# Patient Record
Sex: Male | Born: 1945 | Race: Black or African American | Hispanic: No | Marital: Married | Smoking: Former smoker
Health system: Southern US, Community
[De-identification: ages and names within clinical notes are randomized; demographics above are authoritative.]

## PROBLEM LIST (undated history)

## (undated) DIAGNOSIS — E039 Hypothyroidism, unspecified: Secondary | ICD-10-CM

## (undated) DIAGNOSIS — E119 Type 2 diabetes mellitus without complications: Secondary | ICD-10-CM

## (undated) DIAGNOSIS — I10 Essential (primary) hypertension: Secondary | ICD-10-CM

## (undated) DIAGNOSIS — E079 Disorder of thyroid, unspecified: Secondary | ICD-10-CM

## (undated) DIAGNOSIS — E785 Hyperlipidemia, unspecified: Secondary | ICD-10-CM

## (undated) DIAGNOSIS — M1990 Unspecified osteoarthritis, unspecified site: Secondary | ICD-10-CM

## (undated) DIAGNOSIS — K219 Gastro-esophageal reflux disease without esophagitis: Secondary | ICD-10-CM

## (undated) HISTORY — DX: Type 2 diabetes mellitus without complications: E11.9

## (undated) HISTORY — PX: THYROIDECTOMY: SHX17

## (undated) HISTORY — DX: Hyperlipidemia, unspecified: E78.5

## (undated) HISTORY — DX: Unspecified osteoarthritis, unspecified site: M19.90

## (undated) HISTORY — DX: Essential (primary) hypertension: I10

## (undated) HISTORY — DX: Disorder of thyroid, unspecified: E07.9

---

## 2010-08-02 LAB — CBC WITH AUTO DIFFERENTIAL
Absolute Baso #: 0.02 E9/L (ref 0.00–0.20)
Absolute Eos #: 0.11 E9/L (ref 0.05–0.50)
Absolute Lymph #: 2.03 E9/L (ref 1.50–4.00)
Absolute Mono #: 0.34 E9/L (ref 0.10–0.95)
Absolute Neut #: 3.56 E9/L (ref 1.80–7.30)
Basophils: 0 % (ref 0–2)
Eosinophils %: 2 % (ref 0–6)
HCT: 43.7 % (ref 37.0–54.0)
HGB: 14.7 g/dL (ref 12.5–16.5)
Lymphocytes: 33 % (ref 20–42)
MCH: 32.2 pg (ref 26.0–35.0)
MCHC: 33.7 % (ref 32.0–34.5)
MCV: 95.5 fL (ref 80.0–99.9)
MPV: 9.9 fL (ref 7.0–12.0)
Monocytes: 6 % (ref 2–12)
Platelets: 198 E9/L (ref 130–450)
RBC: 4.58 E12/L (ref 3.80–5.80)
RDW: 12.3 fL (ref 11.5–15.0)
Seg Neutrophils: 59 % (ref 43–80)
WBC: 6.1 E9/L (ref 4.5–11.5)

## 2010-08-02 LAB — COMPREHENSIVE METABOLIC PANEL
ALT: 27 U/L (ref 10–49)
AST: 24 U/L (ref 0–33)
Albumin: 4.2 g/dL (ref 3.2–4.8)
Alkaline Phosphatase: 81 U/L (ref 45–129)
BUN: 14 mg/dL (ref 9–23)
Bilirubin, Total: 0.3 mg/dL (ref 0.3–1.2)
CO2: 31 mmol/L (ref 20–31)
Calcium: 9.2 mg/dL (ref 8.6–10.5)
Chloride: 104 mmol/L (ref 99–109)
Creatinine: 0.8 mg/dL (ref 0.7–1.3)
Glucose: 121 mg/dL — ABNORMAL HIGH (ref 70–110)
Potassium: 4.8 mmol/L (ref 3.5–5.5)
Sodium: 141 mmol/L (ref 132–146)
Total Protein: 7.4 g/dL (ref 5.7–8.2)

## 2010-08-02 LAB — LIPID PANEL
Cholesterol: 227 mg/dL — ABNORMAL HIGH (ref 0–199)
HDL: 47 mg/dL (ref >40.0)
LDL Calculated: 160 mg/dL — ABNORMAL HIGH (ref 0–99)
Triglycerides: 102 mg/dL (ref 0–149)

## 2010-08-02 LAB — TSH: TSH: 0.692 u[IU]/mL (ref 0.350–5.000)

## 2010-08-02 LAB — HEMOGLOBIN A1C: Hemoglobin A1C: 6.4 % — ABNORMAL HIGH (ref 4.8–6.0)

## 2010-08-02 LAB — PSA SCREENING: Screening PSA: 1.76 ng/mL (ref 0.00–4.00)

## 2010-08-02 LAB — T3: T3, Total: 135 ng/dL (ref 60.0–181.0)

## 2010-08-02 LAB — GFR CALCULATED: Gfr Calculated: >60 mL/min/{1.73_m2} (ref >=60)

## 2010-08-02 LAB — T4: T4, Total: 11.2 ug/dL — ABNORMAL HIGH (ref 4.5–10.9)

## 2010-09-06 LAB — COMPREHENSIVE METABOLIC PANEL
ALT: 42 U/L (ref 10–49)
AST: 31 U/L (ref 0–33)
Albumin: 4 g/dL (ref 3.2–4.8)
Alkaline Phosphatase: 76 U/L (ref 45–129)
BUN: 10 mg/dL (ref 9–23)
Bilirubin, Total: 0.3 mg/dL (ref 0.3–1.2)
CO2: 30 mmol/L (ref 20–31)
Calcium: 9.1 mg/dL (ref 8.6–10.5)
Chloride: 106 mmol/L (ref 99–109)
Creatinine: 0.8 mg/dL (ref 0.7–1.3)
Glucose: 104 mg/dL (ref 70–110)
Potassium: 4.6 mmol/L (ref 3.5–5.5)
Sodium: 140 mmol/L (ref 132–146)
Total Protein: 7 g/dL (ref 5.7–8.2)

## 2010-09-06 LAB — LIPID PANEL
Cholesterol: 152 mg/dL (ref 0–199)
HDL: 42 mg/dL (ref >40.0)
LDL Calculated: 93 mg/dL (ref 0–99)
Triglycerides: 85 mg/dL (ref 0–149)

## 2010-09-06 LAB — T4: T4, Total: 10.2 ug/dL (ref 4.5–10.9)

## 2010-09-06 LAB — T3: T3, Total: 156.8 ng/dL (ref 60.0–181.0)

## 2010-09-06 LAB — TSH: TSH: 1.032 u[IU]/mL (ref 0.350–5.000)

## 2010-09-06 LAB — GFR CALCULATED: Gfr Calculated: >60 mL/min/{1.73_m2} (ref >=60)

## 2011-07-31 LAB — CBC WITH AUTO DIFFERENTIAL
Absolute Eos #: 0.13 E9/L (ref 0.05–0.50)
Absolute Lymph #: 2.13 E9/L (ref 1.50–4.00)
Absolute Mono #: 0.36 E9/L (ref 0.10–0.95)
Absolute Neut #: 3.93 E9/L (ref 1.80–7.30)
Basophils Absolute: 0.01 E9/L (ref 0.00–0.20)
Basophils: 0 % (ref 0–2)
Eosinophils %: 2 % (ref 0–6)
Hematocrit: 41.9 % (ref 37.0–54.0)
Hemoglobin: 14 g/dL (ref 12.5–16.5)
Lymphocytes: 33 % (ref 20–42)
MCH: 31.8 pg (ref 26.0–35.0)
MCHC: 33.4 % (ref 32.0–34.5)
MCV: 95.1 fL (ref 80.0–99.9)
MPV: 10.4 fL (ref 7.0–12.0)
Monocytes: 6 % (ref 2–12)
Platelets: 193 E9/L (ref 130–450)
RBC: 4.41 E12/L (ref 3.80–5.80)
RDW: 12.4 fL (ref 11.5–15.0)
Seg Neutrophils: 60 % (ref 43–80)
WBC: 6.6 E9/L (ref 4.5–11.5)

## 2011-07-31 LAB — COMPREHENSIVE METABOLIC PANEL
ALT: 30 U/L (ref 10–49)
AST: 25 U/L (ref 0–33)
Albumin: 4 g/dL (ref 3.2–4.8)
Alkaline Phosphatase: 82 U/L (ref 45–129)
BUN: 12 mg/dL (ref 9–23)
CO2: 29 mmol/L (ref 20–31)
Calcium: 9.4 mg/dL (ref 8.6–10.5)
Chloride: 106 mmol/L (ref 99–109)
Creatinine: 0.8 mg/dL (ref 0.7–1.3)
Glucose: 109 mg/dL (ref 70–110)
Potassium: 4.6 mmol/L (ref 3.5–5.5)
Sodium: 141 mmol/L (ref 132–146)
Total Bilirubin: 0.3 mg/dL (ref 0.3–1.2)
Total Protein: 7.1 g/dL (ref 5.7–8.2)

## 2011-07-31 LAB — TSH: TSH: 1.399 u[IU]/mL (ref 0.350–5.000)

## 2011-07-31 LAB — LIPID PANEL
Cholesterol: 147 mg/dL (ref 0–199)
HDL: 43 mg/dL (ref >40.0)
LDL Calculated: 81 mg/dL (ref 0–99)
Triglycerides: 114 mg/dL (ref 0–149)

## 2011-07-31 LAB — PSA SCREENING: Screening PSA: 2.86 ng/mL (ref 0.00–4.00)

## 2011-07-31 LAB — T3: T3, Total: 139 ng/dL (ref 60.0–181.0)

## 2011-07-31 LAB — HEMOGLOBIN A1C: Hemoglobin A1C: 6.7 % — ABNORMAL HIGH (ref 4.8–6.0)

## 2011-07-31 LAB — GFR CALCULATED: Gfr Calculated: >60 mL/min/{1.73_m2} (ref >=60)

## 2011-07-31 LAB — T4: T4, Total: 11.9 ug/dL — ABNORMAL HIGH (ref 4.5–10.9)

## 2011-09-16 LAB — PROSTATE CANCER GENE 3

## 2012-04-06 LAB — TESTOSTERONE: Testosterone: 190 ng/dL

## 2012-04-06 LAB — PSA, DIAGNOSTIC: Diagnostic Psa: 1.99 ng/mL (ref 0.00–4.00)

## 2012-06-16 MED ORDER — CRESTOR 10 MG PO TABS
10 MG | ORAL_TABLET | ORAL | Status: DC
Start: 2012-06-16 — End: 2013-03-03

## 2013-03-03 MED ORDER — ROSUVASTATIN CALCIUM 10 MG PO TABS
10 MG | ORAL_TABLET | ORAL | Status: DC
Start: 2013-03-03 — End: 2014-08-05

## 2013-03-03 NOTE — Progress Notes (Signed)
Subjective:      Patient ID: Donald Jackson is a 67 y.o. male.  Doing well, no new complaints  HPI  Cholesterol improved  Diabetes improving  HTN improved  Fatigue resolved  Review of Systems   Constitutional: Negative for fever, chills, appetite change, fatigue and unexpected weight change.   HENT: Negative for hearing loss, sore throat, rhinorrhea, sneezing, sinus pressure and tinnitus.    Eyes: Negative for visual disturbance.   Respiratory: Negative for cough, chest tightness and shortness of breath.    Cardiovascular: Negative for chest pain, palpitations and leg swelling.   Gastrointestinal: Negative for nausea, vomiting, abdominal pain and diarrhea.   Endocrine: Negative for cold intolerance and heat intolerance.   Genitourinary: Negative for dysuria, urgency and hematuria.   Musculoskeletal: Negative for myalgias and arthralgias.   Skin: Negative for rash.   Neurological: Negative for dizziness, weakness and light-headedness.   Hematological: Does not bruise/bleed easily.   Psychiatric/Behavioral: Negative for confusion and dysphoric mood.     Past Medical History   Diagnosis Date   ??? Hypertension    ??? Hyperlipidemia        History   Substance Use Topics   ??? Smoking status: Former Smoker     Quit date: 03/03/1988   ??? Smokeless tobacco: Never Used   ??? Alcohol Use: No       Family History   Problem Relation Age of Onset   ??? Diabetes Mother    ??? Cancer Father      Prostate Cancer       Current Outpatient Prescriptions   Medication Sig Dispense Refill   ??? NIFEdipine (ADALAT CC) 30 MG CR tablet Take 30 mg by mouth daily.       ??? aspirin 81 MG tablet Take 81 mg by mouth daily.       ??? CRESTOR 10 MG tablet TAKE 1 TABLET DAILY  90 tablet  2     No current facility-administered medications for this visit.       No Known Allergies    I have reviewed Donald Jackson's allergies, medications, problem list, medical, social and family history and have updated as needed in the electronic medical record.        Objective:   Physical  Exam   Vitals reviewed.  Constitutional: He is oriented to person, place, and time. He appears well-developed and well-nourished.   HENT:   Head: Normocephalic and atraumatic.   Right Ear: External ear normal.   Left Ear: External ear normal.   Nose: Nose normal.   Mouth/Throat: Oropharynx is clear and moist.   Eyes: Conjunctivae and EOM are normal. Pupils are equal, round, and reactive to light.   Neck: Normal range of motion. Neck supple.   Cardiovascular: Normal rate, regular rhythm, normal heart sounds and intact distal pulses.    No murmur heard.  Pulmonary/Chest: Effort normal and breath sounds normal.   Abdominal: Soft. Bowel sounds are normal. There is no tenderness.   Musculoskeletal: Normal range of motion.   Neurological: He is alert and oriented to person, place, and time.   Skin: Skin is warm and dry. No rash noted.   Psychiatric: He has a normal mood and affect. His behavior is normal. Thought content normal.       Assessment / Plan:        Donald Jackson was seen today for check-up.    Diagnoses and associated orders for this visit:    Hypercholesterolemia  - Comprehensive metabolic panel; Future  -  rosuvastatin (CRESTOR) 10 MG tablet; TAKE 1 TABLET DAILY    HTN (hypertension)    Prostate cancer screening  - Psa screening; Future    DM (diabetes mellitus) (HCC)  - Hemoglobin A1c; Future    Fatigue  - CBC Auto Differential; Future  - TSH without Reflex; Future    Depression screening  - Negative Screen for Clinical Depression, Follow-up not Required [phone]    Screening  Comments: Fall Risk Assessment Completed    Other Orders  - NIFEdipine (ADALAT CC) 30 MG CR tablet; Take 30 mg by mouth daily.  - aspirin 81 MG tablet; Take 81 mg by mouth daily.        Reviewed health maintenance summary and advised Donald Jackson of deficiency's. He will consider and notify office when ready to proceed. Discussed need for colonoscopy and age appropriate immunizations  Continue all specialist  Emergency dept any problems

## 2013-03-04 LAB — COMPREHENSIVE METABOLIC PANEL
ALT: 21 U/L (ref 0–40)
AST: 24 U/L (ref 0–39)
Albumin: 4.1 g/dL (ref 3.5–5.2)
Alkaline Phosphatase: 80 U/L (ref 40–129)
BUN: 9 mg/dL (ref 8–23)
CO2: 27 mmol/L (ref 22–29)
Calcium: 9.6 mg/dL (ref 8.6–10.2)
Chloride: 101 mmol/L (ref 98–107)
Creatinine: 0.9 mg/dL (ref 0.7–1.2)
GFR African American: >60
GFR Non-African American: >60 mL/min/{1.73_m2} (ref >=60)
Glucose: 108 mg/dL (ref 74–109)
Potassium: 3.8 mmol/L (ref 3.5–5.0)
Sodium: 139 mmol/L (ref 132–146)
Total Bilirubin: 0.2 mg/dL (ref 0.0–1.2)
Total Protein: 7.5 g/dL (ref 6.4–8.3)

## 2013-03-04 LAB — CBC WITH AUTO DIFFERENTIAL
Basophils %: 0 % (ref 0–2)
Basophils Absolute: 0.04 E9/L (ref 0.00–0.20)
Eosinophils %: 1 % (ref 0–6)
Eosinophils Absolute: 0.12 E9/L (ref 0.05–0.50)
Hematocrit: 42.1 % (ref 37.0–54.0)
Hemoglobin: 14.1 g/dL (ref 12.5–16.5)
Lymphocytes %: 29 % (ref 20–42)
Lymphocytes Absolute: 2.81 E9/L (ref 1.50–4.00)
MCH: 31.8 pg (ref 26.0–35.0)
MCHC: 33.5 % (ref 32.0–34.5)
MCV: 94.8 fL (ref 80.0–99.9)
MPV: 10.1 fL (ref 7.0–12.0)
Monocytes %: 4 % (ref 2–12)
Monocytes Absolute: 0.4 E9/L (ref 0.10–0.95)
Neutrophils %: 65 % (ref 43–80)
Neutrophils Absolute: 6.22 E9/L (ref 1.80–7.30)
Platelets: 202 E9/L (ref 130–450)
RBC: 4.44 E12/L (ref 3.80–5.80)
RDW: 13 fL (ref 11.5–15.0)
WBC: 9.6 E9/L (ref 4.5–11.5)

## 2013-03-04 LAB — PSA SCREENING: PSA: 2.17 ng/mL (ref 0.00–4.00)

## 2013-03-04 LAB — HEMOGLOBIN A1C: Hemoglobin A1C: 6.7 % — ABNORMAL HIGH (ref 4.8–5.9)

## 2013-03-04 LAB — TSH: TSH: 1.38 u[IU]/mL (ref 0.270–4.200)

## 2013-03-17 NOTE — Progress Notes (Signed)
Subjective:      Patient ID: Donald Jackson is a 67 y.o. male.  Doing well, no new complaints  HPI  HTN improved  Cholesterol improved  Obesity uncontrolled  Diabetes diet controlled  Review of Systems   Constitutional: Negative for fever, chills, appetite change, fatigue and unexpected weight change.   HENT: Negative for hearing loss, sore throat, rhinorrhea, sneezing, sinus pressure and tinnitus.    Eyes: Negative for visual disturbance.   Respiratory: Negative for cough, chest tightness and shortness of breath.    Cardiovascular: Negative for chest pain, palpitations and leg swelling.   Gastrointestinal: Negative for nausea, vomiting, abdominal pain and diarrhea.   Endocrine: Negative for cold intolerance and heat intolerance.   Genitourinary: Negative for dysuria, urgency and hematuria.   Musculoskeletal: Negative for myalgias and arthralgias.   Skin: Negative for rash.   Neurological: Negative for dizziness, weakness and light-headedness.   Hematological: Does not bruise/bleed easily.   Psychiatric/Behavioral: Negative for confusion and dysphoric mood.     Past Medical History   Diagnosis Date   ??? Hypertension    ??? Hyperlipidemia        History   Substance Use Topics   ??? Smoking status: Former Smoker     Quit date: 03/03/1988   ??? Smokeless tobacco: Never Used   ??? Alcohol Use: No       Family History   Problem Relation Age of Onset   ??? Diabetes Mother    ??? Cancer Father      Prostate Cancer       Current Outpatient Prescriptions   Medication Sig Dispense Refill   ??? NIFEdipine (ADALAT CC) 30 MG CR tablet Take 30 mg by mouth daily.       ??? aspirin 81 MG tablet Take 81 mg by mouth daily.       ??? rosuvastatin (CRESTOR) 10 MG tablet TAKE 1 TABLET DAILY  90 tablet  3     No current facility-administered medications for this visit.       No Known Allergies    I have reviewed Donald Jackson's allergies, medications, problem list, medical, social and family history and have updated as needed in the electronic medical  record.        Objective:   Physical Exam   Constitutional: He is oriented to person, place, and time. He appears well-developed and well-nourished.   HENT:   Head: Normocephalic and atraumatic.   Right Ear: External ear normal.   Left Ear: External ear normal.   Nose: Nose normal.   Mouth/Throat: Oropharynx is clear and moist.   Eyes: Conjunctivae and EOM are normal. Pupils are equal, round, and reactive to light.   Neck: Normal range of motion.   Cardiovascular: Normal rate, regular rhythm and normal heart sounds.    No murmur heard.  Pulmonary/Chest: Effort normal and breath sounds normal.   Abdominal: Soft. Bowel sounds are normal. There is no tenderness.   Musculoskeletal: Normal range of motion.   Neurological: He is alert and oriented to person, place, and time.   Skin: Skin is warm and dry. No rash noted.   Psychiatric: He has a normal mood and affect. His behavior is normal. Thought content normal.   Vitals reviewed.        Assessment / Plan:        Donald Jackson was seen today for 2 week follow-up and discuss labs.    Diagnoses and associated orders for this visit:    Hypercholesterolemia  HTN (hypertension)    Colon cancer screening  - External Referral To Gastroenterology    DM (diabetes mellitus) (HCC)    Obesity      Reviewed health maintenance summary and advised Donald Jackson of deficiency's. He will consider and notify office when ready to proceed. Discussed need for colonoscopy and age appropriate immunizations.  Discussed importance of diet, exercise and weight loss  Continue all specialist  Emergency dept any problems

## 2013-07-29 MED ORDER — CLOTRIMAZOLE-BETAMETHASONE 1-0.05 % EX CREA
CUTANEOUS | Status: DC
Start: 2013-07-29 — End: 2015-02-14

## 2013-07-29 NOTE — Progress Notes (Signed)
Subjective:      Patient ID: Donald Jackson is a 67 y.o. male.  Left ear pain for the past couple of days improved today  Otalgia   Associated symptoms include a rash. Pertinent negatives include no abdominal pain, coughing, diarrhea, hearing loss, rhinorrhea, sore throat or vomiting.   Itchy rash on extremities for past week   HTN will continue to monitor  Obesity uncontrolled  Cholesterol needs reevaluated in 1 month  Diabetes currently controlled with diet  Review of Systems   Constitutional: Negative for fever, chills, appetite change, fatigue and unexpected weight change.   HENT: Positive for ear pain (resolved ). Negative for hearing loss, rhinorrhea, sinus pressure, sneezing, sore throat and tinnitus.    Eyes: Negative for visual disturbance.   Respiratory: Negative for cough, chest tightness and shortness of breath.    Cardiovascular: Negative for chest pain, palpitations and leg swelling.   Gastrointestinal: Negative for nausea, vomiting, abdominal pain and diarrhea.   Endocrine: Negative for cold intolerance and heat intolerance.   Genitourinary: Negative for dysuria, urgency and hematuria.   Musculoskeletal: Negative for myalgias and arthralgias.   Skin: Positive for rash.   Neurological: Negative for dizziness, weakness and light-headedness.   Hematological: Does not bruise/bleed easily.   Psychiatric/Behavioral: Negative for confusion and dysphoric mood.     Past Medical History   Diagnosis Date   ??? Hypertension    ??? Hyperlipidemia        History   Substance Use Topics   ??? Smoking status: Former Smoker     Quit date: 03/03/1988   ??? Smokeless tobacco: Never Used   ??? Alcohol Use: No       Family History   Problem Relation Age of Onset   ??? Diabetes Mother    ??? Cancer Father      Prostate Cancer       Current Outpatient Prescriptions   Medication Sig Dispense Refill   ??? NIFEdipine (ADALAT CC) 30 MG CR tablet Take 30 mg by mouth daily.       ??? aspirin 81 MG tablet Take 81 mg by mouth daily.       ???  rosuvastatin (CRESTOR) 10 MG tablet TAKE 1 TABLET DAILY  90 tablet  3     No current facility-administered medications for this visit.       No Known Allergies    I have reviewed Donald Jackson's allergies, medications, problem list, medical, social and family history and have updated as needed in the electronic medical record.    ROS: negative other what was mentioned above.         Objective:   Physical Exam   Constitutional: He is oriented to person, place, and time. He appears well-developed and well-nourished.   HENT:   Head: Normocephalic and atraumatic.   Right Ear: External ear normal.   Left Ear: External ear normal.   Nose: Nose normal.   Mouth/Throat: Oropharynx is clear and moist.   Eyes: Conjunctivae and EOM are normal. Pupils are equal, round, and reactive to light.   Neck: Normal range of motion. Neck supple.   Cardiovascular: Normal rate, regular rhythm, normal heart sounds and intact distal pulses.    No murmur heard.  Pulmonary/Chest: Effort normal and breath sounds normal.   Abdominal: Soft. Bowel sounds are normal. There is no tenderness.   Musculoskeletal: Normal range of motion.   Neurological: He is alert and oriented to person, place, and time.   Skin: Skin is warm and  dry. Rash (diffuuse areas of mild scaly erythematous rash on extremities) noted.   Psychiatric: He has a normal mood and affect. His behavior is normal. Thought content normal.   Vitals reviewed.      Assessment / Plan:        Donald Jackson was seen today for otalgia.    Diagnoses and associated orders for this visit:    Tinea  - clotrimazole-betamethasone (LOTRISONE) 1-0.05 % cream; Apply topically 2 times daily.    Hypercholesterolemia    HTN (hypertension)    DM (diabetes mellitus) (HCC)    Obesity      Continue all specialist  Discussed importance of diet, exercise and weight loss. Discussed dietary restriction to help improve blood sugars  Reviewed health maintenance summary and advised Donald Jackson of deficiency's. He will consider and notify  office when ready to proceed. Discussed importance of colonoscopy and immunization, he is aware of the risk and my concern of not having  Emergency dept any problems

## 2013-09-06 NOTE — Progress Notes (Signed)
Lab work drawn. Patient to follow up in 1 or 2 weeks to discuss lab results with Dr. Marva PandaKraker

## 2013-09-07 LAB — CBC WITH AUTO DIFFERENTIAL
Basophils %: 1 % (ref 0–2)
Basophils Absolute: 0.03 E9/L (ref 0.00–0.20)
Eosinophils %: 2 % (ref 0–6)
Eosinophils Absolute: 0.13 E9/L (ref 0.05–0.50)
Hematocrit: 41.3 % (ref 37.0–54.0)
Hemoglobin: 13.8 g/dL (ref 12.5–16.5)
Lymphocytes %: 34 % (ref 20–42)
Lymphocytes Absolute: 2.09 E9/L (ref 1.50–4.00)
MCH: 31.7 pg (ref 26.0–35.0)
MCHC: 33.4 % (ref 32.0–34.5)
MCV: 94.9 fL (ref 80.0–99.9)
MPV: 9.7 fL (ref 7.0–12.0)
Monocytes %: 5 % (ref 2–12)
Monocytes Absolute: 0.3 E9/L (ref 0.10–0.95)
Neutrophils %: 58 % (ref 43–80)
Neutrophils Absolute: 3.59 E9/L (ref 1.80–7.30)
Platelets: 205 E9/L (ref 130–450)
RBC: 4.36 E12/L (ref 3.80–5.80)
RDW: 12.8 fL (ref 11.5–15.0)
WBC: 6.1 E9/L (ref 4.5–11.5)

## 2013-09-07 LAB — COMPREHENSIVE METABOLIC PANEL
ALT: 24 U/L (ref 0–40)
AST: 22 U/L (ref 0–39)
Albumin: 3.8 g/dL (ref 3.5–5.2)
Alkaline Phosphatase: 87 U/L (ref 40–129)
BUN: 10 mg/dL (ref 8–23)
CO2: 26 mmol/L (ref 22–29)
Calcium: 9.2 mg/dL (ref 8.6–10.2)
Chloride: 106 mmol/L (ref 98–107)
Creatinine: 0.9 mg/dL (ref 0.7–1.2)
GFR African American: >60
GFR Non-African American: >60 mL/min/{1.73_m2} (ref >=60)
Glucose: 115 mg/dL — ABNORMAL HIGH (ref 74–109)
Potassium: 4.6 mmol/L (ref 3.5–5.0)
Sodium: 142 mmol/L (ref 132–146)
Total Bilirubin: 0.3 mg/dL (ref 0.0–1.2)
Total Protein: 7.1 g/dL (ref 6.4–8.3)

## 2013-09-07 LAB — LIPID PANEL
Cholesterol, Total: 157 mg/dL (ref 0–199)
HDL: 48 mg/dL (ref >40)
LDL Calculated: 91 mg/dL (ref 0–99)
Triglycerides: 89 mg/dL (ref 0–149)
VLDL Cholesterol Calculated: 18 mg/dL

## 2013-09-07 LAB — HEMOGLOBIN A1C: Hemoglobin A1C: 6.9 % — ABNORMAL HIGH (ref 4.8–5.9)

## 2013-09-07 LAB — TSH: TSH: 0.222 u[IU]/mL — ABNORMAL LOW (ref 0.270–4.200)

## 2013-09-07 LAB — PSA SCREENING: PSA: 2.92 ng/mL (ref 0.00–4.00)

## 2014-08-07 MED ORDER — CRESTOR 10 MG PO TABS
10 MG | ORAL_TABLET | ORAL | Status: DC
Start: 2014-08-07 — End: 2014-10-26

## 2014-09-15 LAB — CBC
Hematocrit: 44.1 % (ref 37.0–54.0)
Hemoglobin: 14.5 g/dL (ref 12.5–16.5)
MCH: 31.1 pg (ref 26.0–35.0)
MCHC: 32.9 % (ref 32.0–34.5)
MCV: 94.7 fL (ref 80.0–99.9)
MPV: 9 fL (ref 7.0–12.0)
Platelets: 215 E9/L (ref 130–450)
RBC: 4.65 E12/L (ref 3.80–5.80)
RDW: 12.6 fL (ref 11.5–15.0)
WBC: 6.8 E9/L (ref 4.5–11.5)

## 2014-09-15 LAB — COMPREHENSIVE METABOLIC PANEL
ALT: 30 U/L (ref 0–40)
AST: 26 U/L (ref 0–39)
Albumin: 3.8 g/dL (ref 3.5–5.2)
Alkaline Phosphatase: 85 U/L (ref 40–129)
Anion Gap: 10 mmol/L (ref 7–16)
BUN: 8 mg/dL (ref 8–23)
CO2: 30 mmol/L — ABNORMAL HIGH (ref 22–29)
Calcium: 9.4 mg/dL (ref 8.6–10.2)
Chloride: 102 mmol/L (ref 98–107)
Creatinine: 0.9 mg/dL (ref 0.7–1.2)
GFR African American: >60
GFR Non-African American: >60 mL/min/{1.73_m2} (ref >=60)
Glucose: 140 mg/dL — ABNORMAL HIGH (ref 74–109)
Potassium: 4.6 mmol/L (ref 3.5–5.0)
Sodium: 142 mmol/L (ref 132–146)
Total Bilirubin: 0.3 mg/dL (ref 0.0–1.2)
Total Protein: 7.5 g/dL (ref 6.4–8.3)

## 2014-09-15 LAB — TSH: TSH: 1.16 u[IU]/mL (ref 0.270–4.200)

## 2014-09-15 LAB — HIGH SENSITIVITY CRP: CRP High Sensitivity: 2.4 mg/L (ref 0.0–3.0)

## 2014-09-15 LAB — LIPID PANEL
Cholesterol, Total: 170 mg/dL (ref 0–199)
HDL: 56 mg/dL (ref >40)
LDL Calculated: 100 mg/dL — ABNORMAL HIGH (ref 0–99)
Triglycerides: 71 mg/dL (ref 0–149)
VLDL Cholesterol Calculated: 14 mg/dL

## 2014-09-15 LAB — T3: T3, Total: 141.8 ng/dL (ref 80.00–200.00)

## 2014-09-15 LAB — T4: T4, Total: 8.8 ug/dL (ref 4.5–11.7)

## 2014-10-26 ENCOUNTER — Ambulatory Visit: Admit: 2014-10-26 | Discharge: 2014-10-26 | Payer: MEDICARE | Attending: Family Medicine | Primary: Geriatric Medicine

## 2014-10-26 DIAGNOSIS — I10 Essential (primary) hypertension: Secondary | ICD-10-CM

## 2014-10-26 MED ORDER — LISINOPRIL 10 MG PO TABS
10 MG | ORAL_TABLET | Freq: Every day | ORAL | Status: AC
Start: 2014-10-26 — End: ?

## 2014-10-26 MED ORDER — METFORMIN HCL 500 MG PO TABS
500 MG | ORAL_TABLET | Freq: Two times a day (BID) | ORAL | Status: DC
Start: 2014-10-26 — End: 2015-02-14

## 2014-10-26 MED ORDER — ROSUVASTATIN CALCIUM 10 MG PO TABS
10 MG | ORAL_TABLET | ORAL | Status: AC
Start: 2014-10-26 — End: ?

## 2014-10-26 NOTE — Patient Instructions (Signed)
Follow up after thyroid ultrasound to discuss results with Dr. Marva PandaKraker

## 2014-10-26 NOTE — Progress Notes (Signed)
Subjective:      Patient ID: Donald Jackson is a 69 y.o. male.  Here for evaluation of medical problems  HPI  Abnormal thyroid found on carotid artery ultrasound, will dedicate ultrasound for thyroid nodule and possible ENT evaluation  HTN will continue to monitor  Obesity uncontrolled  Cholesterol needs reevaluated in 1 month  Diabetes currently controlled with diet  Review of Systems   Constitutional: Negative for fever, chills, appetite change, fatigue and unexpected weight change.   HENT: Negative for hearing loss, rhinorrhea, sinus pressure, sneezing, sore throat and tinnitus.    Eyes: Negative for visual disturbance.   Respiratory: Negative for cough, chest tightness and shortness of breath.    Cardiovascular: Negative for chest pain, palpitations and leg swelling.   Gastrointestinal: Negative for nausea, vomiting, abdominal pain and diarrhea.   Endocrine: Negative for cold intolerance and heat intolerance.   Genitourinary: Negative for dysuria, urgency and hematuria.   Musculoskeletal: Negative for myalgias and arthralgias.   Skin: Negative for rash.   Neurological: Negative for dizziness, weakness and light-headedness.   Hematological: Does not bruise/bleed easily.   Psychiatric/Behavioral: Negative for confusion and dysphoric mood.     Past Medical History   Diagnosis Date   ??? Hypertension    ??? Hyperlipidemia        History   Substance Use Topics   ??? Smoking status: Former Smoker     Quit date: 03/03/1988   ??? Smokeless tobacco: Never Used   ??? Alcohol Use: No       Family History   Problem Relation Age of Onset   ??? Diabetes Mother    ??? Cancer Father      Prostate Cancer       Current Outpatient Prescriptions   Medication Sig Dispense Refill   ??? CRESTOR 10 MG tablet TAKE 1 TABLET DAILY 90 tablet 0   ??? NIFEdipine (ADALAT CC) 30 MG CR tablet Take 30 mg by mouth daily.     ??? aspirin 81 MG tablet Take 81 mg by mouth daily.     ??? clotrimazole-betamethasone (LOTRISONE) 1-0.05 % cream Apply topically 2 times daily.  1 Tube 2     No current facility-administered medications for this visit.       No Known Allergies    I have reviewed Donald Jackson's allergies, medications, problem list, medical, social and family history and have updated as needed in the electronic medical record.    ROS: negative other what was mentioned above.         Objective:   Physical Exam   Constitutional: He is oriented to person, place, and time. He appears well-developed and well-nourished.   HENT:   Head: Normocephalic and atraumatic.   Right Ear: External ear normal.   Left Ear: External ear normal.   Nose: Nose normal.   Mouth/Throat: Oropharynx is clear and moist.   Eyes: Conjunctivae and EOM are normal. Pupils are equal, round, and reactive to light.   Neck: Normal range of motion. Neck supple.   Cardiovascular: Normal rate, regular rhythm and normal heart sounds.    No murmur heard.  Pulmonary/Chest: Effort normal and breath sounds normal.   Abdominal: Soft. Bowel sounds are normal. There is no tenderness.   Musculoskeletal: Normal range of motion.   Neurological: He is alert and oriented to person, place, and time.   Skin: Skin is warm and dry. No rash noted.   Psychiatric: He has a normal mood and affect. His behavior is normal. Thought  content normal.   Vitals reviewed.        Assessment / Plan:        Donald Jackson was seen today for hypertension, hyperlipidemia and results.    Diagnoses and associated orders for this visit:    Essential hypertension  - lisinopril (PRINIVIL;ZESTRIL) 10 MG tablet; Take 1 tablet by mouth daily    Hypercholesterolemia  - rosuvastatin (CRESTOR) 10 MG tablet; TAKE 1 TABLET DAILY    Type 2 diabetes mellitus without complication (HCC)  - metFORMIN (GLUCOPHAGE) 500 MG tablet; Take 1 tablet by mouth 2 times daily (with meals)    Thyroid nodule  - Cancel: US Thyroid  - US Head Neck Soft Tissue Thyroid  - [facility]- Bunevich, Jared, DO          Will obtain thyroid ultrasound and if any abnormalities will consult  ENT  Follow up in 2 weeks to reevaluate blood pressure, HGA1C and review thyroid ultrasound  Continue all specialist  Discussed importance of diet, exercise and weight loss. Discussed dietary restriction to help improve blood sugars  Reviewed health maintenance summary and advised Donald Jackson of deficiency's. He will consider and notify office when ready to proceed. Discussed importance of colonoscopy and immunization, he is aware of the risk and my concern of not having  Emergency dept any problems

## 2014-10-31 ENCOUNTER — Inpatient Hospital Stay: Admit: 2014-10-31 | Attending: Family Medicine | Primary: Geriatric Medicine

## 2014-11-30 ENCOUNTER — Ambulatory Visit: Admit: 2014-11-30 | Discharge: 2014-11-30 | Payer: MEDICARE | Attending: Family Medicine | Primary: Geriatric Medicine

## 2014-11-30 DIAGNOSIS — I10 Essential (primary) hypertension: Secondary | ICD-10-CM

## 2014-11-30 NOTE — Progress Notes (Signed)
Subjective:      Patient ID: Donald Jackson is a 69 y.o. male.  Here for follow up  HPI  Abnormal thyroid found on carotid artery ultrasound, thyroid ultrasound shows multiple nodules and enlarged thyroid, will consult ENT  HTN will continue to monitor   Obesity uncontrolled  Cholesterol needs reevaluated   Diabetes currently controlled with diet  Review of Systems   Constitutional: Negative for fever, chills, appetite change, fatigue and unexpected weight change.   HENT: Negative for hearing loss, rhinorrhea, sinus pressure, sneezing, sore throat and tinnitus.    Eyes: Negative for visual disturbance.   Respiratory: Negative for cough, chest tightness and shortness of breath.    Cardiovascular: Negative for chest pain, palpitations and leg swelling.   Gastrointestinal: Negative for nausea, vomiting, abdominal pain and diarrhea.   Endocrine: Negative for cold intolerance and heat intolerance.   Genitourinary: Negative for dysuria, urgency and hematuria.   Musculoskeletal: Negative for myalgias and arthralgias.   Skin: Negative for rash.   Neurological: Negative for dizziness, weakness and light-headedness.   Hematological: Does not bruise/bleed easily.   Psychiatric/Behavioral: Negative for confusion and dysphoric mood.     Past Medical History   Diagnosis Date   ??? Hypertension    ??? Hyperlipidemia        History   Substance Use Topics   ??? Smoking status: Former Smoker     Quit date: 03/03/1988   ??? Smokeless tobacco: Never Used   ??? Alcohol Use: No       Family History   Problem Relation Age of Onset   ??? Diabetes Mother    ??? Cancer Father      Prostate Cancer       Current Outpatient Prescriptions   Medication Sig Dispense Refill   ??? metFORMIN (GLUCOPHAGE) 500 MG tablet Take 1 tablet by mouth 2 times daily (with meals) 60 tablet 3   ??? lisinopril (PRINIVIL;ZESTRIL) 10 MG tablet Take 1 tablet by mouth daily 30 tablet 3   ??? rosuvastatin (CRESTOR) 10 MG tablet TAKE 1 TABLET DAILY 90 tablet 3   ??? clotrimazole-betamethasone  (LOTRISONE) 1-0.05 % cream Apply topically 2 times daily. 1 Tube 2   ??? NIFEdipine (ADALAT CC) 30 MG CR tablet Take 30 mg by mouth daily.     ??? aspirin 81 MG tablet Take 81 mg by mouth daily.       No current facility-administered medications for this visit.       No Known Allergies    I have reviewed Donald Jackson's allergies, medications, problem list, medical, social and family history and have updated as needed in the electronic medical record.    ROS: negative other what was mentioned above.         Objective:   Physical Exam   Constitutional: He is oriented to person, place, and time. He appears well-developed and well-nourished.   HENT:   Head: Normocephalic and atraumatic.   Right Ear: External ear normal.   Left Ear: External ear normal.   Mouth/Throat: Oropharynx is clear and moist.   Eyes: Conjunctivae and EOM are normal. Pupils are equal, round, and reactive to light.   Neck: Normal range of motion. Neck supple. Thyromegaly present.   Cardiovascular: Normal rate, regular rhythm and normal heart sounds.    No murmur heard.  Pulmonary/Chest: Effort normal and breath sounds normal.   Abdominal: Soft. Bowel sounds are normal. There is no tenderness.   Musculoskeletal: Normal range of motion.   Neurological: He is  alert and oriented to person, place, and time.   Skin: Skin is warm and dry. No rash noted.   Psychiatric: He has a normal mood and affect. His behavior is normal. Thought content normal.   Vitals reviewed.        Assessment / Plan:        Donald Jackson was seen today for results and hypertension.    Diagnoses and associated orders for this visit:    Essential hypertension    Hypercholesterolemia    Type 2 diabetes mellitus without complication (HCC)    Thyroid nodule    Obesity due to excess calories, unspecified obesity severity        Continue all specialist including ENT  Discussed importance of diet, exercise and weight loss. Discussed dietary restriction to help improve blood sugars  Reviewed health maintenance  summary and advised Donald Jackson of deficiency's. He will consider and notify office when ready to proceed. Discussed importance of colonoscopy and immunization, he is aware of the risk and my concern of not having  Emergency dept any problems

## 2014-12-15 ENCOUNTER — Ambulatory Visit
Admit: 2014-12-15 | Discharge: 2014-12-15 | Payer: MEDICARE | Attending: Facial Plastic Surgery | Primary: Geriatric Medicine

## 2014-12-15 DIAGNOSIS — E041 Nontoxic single thyroid nodule: Secondary | ICD-10-CM

## 2015-01-02 ENCOUNTER — Inpatient Hospital Stay: Admit: 2015-01-02 | Payer: MEDICARE | Attending: Facial Plastic Surgery | Primary: Geriatric Medicine

## 2015-01-02 DIAGNOSIS — E041 Nontoxic single thyroid nodule: Secondary | ICD-10-CM

## 2015-01-05 ENCOUNTER — Encounter: Attending: Facial Plastic Surgery | Primary: Geriatric Medicine

## 2015-01-12 ENCOUNTER — Ambulatory Visit
Admit: 2015-01-12 | Discharge: 2015-01-12 | Payer: MEDICARE | Attending: Facial Plastic Surgery | Primary: Geriatric Medicine

## 2015-01-12 DIAGNOSIS — E041 Nontoxic single thyroid nodule: Secondary | ICD-10-CM

## 2015-01-12 NOTE — Patient Instructions (Signed)
Do not take any Aspirin products or Ibuprofen 7 days prior to the surgery. Tylenol is ok to take.    [facility] [place] [facility],[address],[place]  will call you a couple days prior to the surgery and will give you further instruction,if any questions call them at [phone].    Surgery date is 02/20/15.

## 2015-01-15 NOTE — Progress Notes (Signed)
[facility]  Dr. Jilda PandaJared D. Jamelia Varano, D.O. Ms.Ed.  New Consult       Patient Name:  Donald DillCarl H Jackson  DOB:  11/08/1945     CHIEF C/O:    Chief Complaint   Patient presents with   ??? Thyroid Problem     us showed something and was sent here       HISTORY OBTAINED FROM:  patient    HISTORY OF PRESENT ILLNESS:       Donald Jackson is a 69 y.o. year old male, here today for evaluation of a thyroid nodule found on ultrasound by routine exam from her primary care.  Patient has no prior history of thyroid nodularity workup, he does have family history for her daughter with thyroid cancer.  Patient denies any changes in his voice, or difficulty swallowing.  Patient denies any hypo-or hyperthyroid Symptoms at this time.  Patient denies any associated nasal congestion sore throat fever chills weight loss dizziness hearing loss or vertigo at this time.      Past Medical History   Diagnosis Date   ??? Hypertension    ??? Hyperlipidemia    ??? DM (diabetes mellitus) (HCC)      No past surgical history on file.    Current outpatient prescriptions:   ???  metFORMIN (GLUCOPHAGE) 500 MG tablet, Take 1 tablet by mouth 2 times daily (with meals), Disp: 60 tablet, Rfl: 3  ???  lisinopril (PRINIVIL;ZESTRIL) 10 MG tablet, Take 1 tablet by mouth daily, Disp: 30 tablet, Rfl: 3  ???  rosuvastatin (CRESTOR) 10 MG tablet, TAKE 1 TABLET DAILY, Disp: 90 tablet, Rfl: 3  ???  NIFEdipine (ADALAT CC) 30 MG CR tablet, Take 30 mg by mouth daily., Disp: , Rfl:   ???  aspirin 81 MG tablet, Take 81 mg by mouth daily., Disp: , Rfl:   ???  clotrimazole-betamethasone (LOTRISONE) 1-0.05 % cream, Apply topically 2 times daily., Disp: 1 Tube, Rfl: 2  Review of patient's allergies indicates no known allergies.  History   Substance Use Topics   ??? Smoking status: Former Smoker     Quit date: 03/03/1988   ??? Smokeless tobacco: Never Used   ??? Alcohol Use: No     Family History   Problem Relation Age of Onset   ??? Diabetes Mother    ??? Cancer Father      Prostate Cancer       Review of  Systems   Constitutional: Negative for fever and chills.   HENT: Negative for ear discharge and hearing loss.    Eyes: Negative for blurred vision and double vision.   Respiratory: Negative for cough and shortness of breath.    Cardiovascular: Negative for chest pain and palpitations.   Gastrointestinal: Negative for heartburn and vomiting.   Skin: Negative for itching and rash.   Neurological: Negative for dizziness, tingling and headaches.   Endo/Heme/Allergies: Negative for environmental allergies. Does not bruise/bleed easily.   All other systems reviewed and are negative.      BP 133/85 mmHg   Pulse 60   Ht 6\' 1"  (1.854 m)   Wt 250 lb 1.9 oz (113.454 kg)   BMI 33.01 kg/m2  Physical Exam   Constitutional: He appears well-developed and well-nourished.   HENT:   Head: Normocephalic and atraumatic.   Right Ear: Ear canal normal.   Left Ear: Hearing and ear canal normal.   Nose: No mucosal edema or rhinorrhea.   Mouth/Throat: Uvula is midline and mucous membranes are normal.  No oropharyngeal exudate, posterior oropharyngeal edema or posterior oropharyngeal erythema.   Eyes: EOM are normal. Pupils are equal, round, and reactive to light.   Neck: Normal range of motion. No tracheal deviation present. Thyromegaly present.   Cardiovascular: Normal rate and intact distal pulses.    Pulmonary/Chest: Effort normal. No respiratory distress.   Musculoskeletal: Normal range of motion. He exhibits no edema.   Lymphadenopathy:     He has no cervical adenopathy.   Neurological: He is alert. No cranial nerve deficit.   Skin: Skin is warm. No erythema.   Nursing note and vitals reviewed.      IMPRESSION/PLAN:  Patient seen and examined with a large 4.8 cm right-sided thyroid nodule.  Recommendations are for the patient undergo fine needle aspirate biopsy, and he will follow-up by timeframe for review of pathology and decision making timeframe.  No new associated changes at this time, risk and benefits have been reviewed.    Dr.  Jilda Panda D. Lakysha Kossman D.O. Ms. Renae Fickle.  Otolaryngology Facial Plastic Surgery  Program Director:  [facility] Otolaryngology/Facial Plastic Surgery Residency  Associate Clinical Professor:  Edd Fabian, [employer]  [facility]

## 2015-02-13 NOTE — Progress Notes (Signed)
[facility]  Dr. Jilda Panda D. Bayard Males, D.O. Ms.Ed        Patient Name:  Donald Jackson  DOB:  1946-02-26     CHIEF C/O:    Chief Complaint   Patient presents with   ??? Results     fna       HISTORY OBTAINED FROM:  patient    HISTORY OF PRESENT ILLNESS:       Donald Jackson is a 69 y.o. year old male, here today for follow up of follow-up biopsy for left thyroid nodule. Patient tolerated biopsy well, no overlying swelling of difficulty swallowing no changes in voice.  No new complaints this time.      Past Medical History   Diagnosis Date   ??? DM (diabetes mellitus) (HCC)    ??? Hyperlipidemia    ??? Hypertension      No past surgical history on file.    Current Outpatient Prescriptions:   ???  metFORMIN (GLUCOPHAGE) 500 MG tablet, Take 1 tablet by mouth 2 times daily (with meals), Disp: 60 tablet, Rfl: 3  ???  lisinopril (PRINIVIL;ZESTRIL) 10 MG tablet, Take 1 tablet by mouth daily, Disp: 30 tablet, Rfl: 3  ???  rosuvastatin (CRESTOR) 10 MG tablet, TAKE 1 TABLET DAILY, Disp: 90 tablet, Rfl: 3  ???  clotrimazole-betamethasone (LOTRISONE) 1-0.05 % cream, Apply topically 2 times daily., Disp: 1 Tube, Rfl: 2  ???  NIFEdipine (ADALAT CC) 30 MG CR tablet, Take 30 mg by mouth daily., Disp: , Rfl:   ???  aspirin 81 MG tablet, Take 81 mg by mouth daily., Disp: , Rfl:   Review of patient's allergies indicates no known allergies.  Social History   Substance Use Topics   ??? Smoking status: Former Smoker     Quit date: 03/03/1988   ??? Smokeless tobacco: Never Used   ??? Alcohol use No     Family History   Problem Relation Age of Onset   ??? Diabetes Mother    ??? Cancer Father      Prostate Cancer       Review of Systems   Constitutional: Negative for chills and fever.   HENT: Negative for ear discharge and hearing loss.    Eyes: Negative for blurred vision and double vision.   Respiratory: Negative for cough and shortness of breath.    Cardiovascular: Negative for chest pain and palpitations.   Gastrointestinal: Negative for heartburn and vomiting.   Skin:  Negative for itching and rash.   Neurological: Negative for dizziness, tingling and headaches.   Endo/Heme/Allergies: Negative for environmental allergies. Does not bruise/bleed easily.   All other systems reviewed and are negative.      BP 143/80  Pulse 64  Ht 6\' 2"  (1.88 m)  Wt 247 lb (112 kg)  BMI 31.71 kg/m2  Physical Exam   Constitutional: He appears well-developed and well-nourished.   Eyes: EOM are normal. Pupils are equal, round, and reactive to light.   Neck: Normal range of motion. No tracheal deviation present. Thyromegaly present.   Large palpable thyroid nodule   Cardiovascular: Normal rate and intact distal pulses.    Pulmonary/Chest: Effort normal. No respiratory distress.   Musculoskeletal: Normal range of motion. He exhibits no edema.   Lymphadenopathy:     He has no cervical adenopathy.   Neurological: He is alert. No cranial nerve deficit.   Skin: Skin is warm. No erythema.   Nursing note and vitals reviewed.      IMPRESSION/PLAN:  Patient presents  a 4.8 cm left-sided thyroid nodule inconclusive for malignancy.  Recommendations are for a left-sided thyroidectomy versus total thyroidectomy.  Given the fact of include conclusive results as well as patient's risk factors and size recommendation is for a thyroidectomy.  Risks and benefits including bleeding infection recurrent laryngeal nerve injury hoarseness and permanent loss of voice with possible tracheostomy were all reviewed.  Patient will follow up with him in 1 week,    Dr. Jilda Panda D. Nichols Corter D.O. Ms. Renae Fickle.  Otolaryngology Facial Plastic Surgery  Program Director:  [facility] Otolaryngology/Facial Plastic Surgery Residency  Associate Clinical Professor:  Edd Fabian, [employer]  [facility]

## 2015-02-14 NOTE — Patient Instructions (Signed)
[facility] PRE-ADMISSION TESTING INSTRUCTIONS    The Preadmission Testing patient is instructed accordingly using the following criteria (check applicable):    ARRIVAL INSTRUCTIONS:  [x] Parking the day of Surgery is located in the Main Entrance lot.  Upon entering the door, make an immediate right to the surgery reception desk    [x] Bring photo ID and insurance card    [x] Bring in a copy of Living will or Durable Power of attorney papers.    [x] Please be sure to arrange transportation to and from the hospital    [x] Please arrange for someone to be with you the remainder of the day due to having anesthesia      GENERAL INSTRUCTIONS:    [x] Nothing by mouth after midnight, including gum, candy, mints or water    [x] You may brush your teeth, but do not swallow any water    [] Take medications as instructed with 1-2 oz of water    [] Stop herbal supplements and vitamins 5 days prior to procedure    [x] Follow preop dosing of blood thinners per physician instructions    [] Do not take insulin or oral diabetic medications    [] If diabetic and have low blood sugar or feel symptomatic, take 1-2oz apple juice or glucose tablets    [] Bring inhalers day of surgery    [] Bring C-PAP/ Bi-Pap day of surgery    [] Bring urine specimen day of surgery    [x] Antibacterial Soap shower or bath AM of Surgery, no lotion, powders or creams to surgical site    [] Follow bowel prep as instructed per surgeon    [x] No smoking within 24 hours of surgery     [x] No alcohol or illegal drug use within 24 hours of surgery.    [x] Jewelry, body piercing's, eyeglasses, contact lenses and dentures are not permitted into surgery (bring cases)      [] Please do not wear any nail polish or make up on the day of surgery    [x] If not already done, you can expect a call from registration    [x] If surgeon requests a time change you will be notified the day prior to surgery    [x] If you receive a survey after surgery  we would greatly appreciate your comments    [] Parent/guardian of a minor must accompany their child and remain on the premises  the entire time they are under our care     [] Pediatric patients may bring favorite toy, blanket or comfort item with them    [] A caregiver or family member must remain with the patient during their stay if they are mentally handicapped, have dementia, disoriented or unable to use a call light or would be a safety concern if left unattended    [x] Please notify surgeon if you develop any illness between now and time of surgery (cold, cough, sore throat, fever, nausea, vomiting) or any signs of infections  including skin, wounds, and dental.    [] Other instructions    EDUCATIONAL MATERIALS PROVIDED:    [] PAT Preoperative Education Packet/Booklet     [] Medication List    [] Fluoroscopy Information Pamphlet    [] Transfusion bracelet applied with instructions    [] Joint replacement video reviewed    [] Shower with antibacterial soap and use CHG wipes provided the evening before surgery as instructed

## 2015-02-19 NOTE — H&P (Signed)
[facility]  Dr. Jilda Panda D. Zellie Jenning, D.O. Ms.Ed  ??  ??  ??  Patient Name: Donald Jackson  DOB:  Sep 17, 1945   ??  CHIEF C/O:        Chief Complaint   Patient presents with   ??? Results   ?? ?? fna   ??  ??  HISTORY OBTAINED FROM: patient  ??  HISTORY OF PRESENT ILLNESS:  Donald Jackson is a 69 y.o. year old male, here today for follow up of follow-up biopsy for left thyroid nodule. Patient tolerated biopsy well, no overlying swelling of difficulty swallowing no changes in voice. No new complaints this time.  ??  ??   Past??Medical??History         Past Medical History   Diagnosis Date   ??? DM (diabetes mellitus) (HCC) ??   ??? Hyperlipidemia ??   ??? Hypertension ??      ??   Past??Surgical??History    No past surgical history on file.        Current??Medication    ??  Current Outpatient Prescriptions:   ??? metFORMIN (GLUCOPHAGE) 500 MG tablet, Take 1 tablet by mouth 2 times daily (with meals), Disp: 60 tablet, Rfl: 3  ??? lisinopril (PRINIVIL;ZESTRIL) 10 MG tablet, Take 1 tablet by mouth daily, Disp: 30 tablet, Rfl: 3  ??? rosuvastatin (CRESTOR) 10 MG tablet, TAKE 1 TABLET DAILY, Disp: 90 tablet, Rfl: 3  ??? clotrimazole-betamethasone (LOTRISONE) 1-0.05 % cream, Apply topically 2 times daily., Disp: 1 Tube, Rfl: 2  ??? NIFEdipine (ADALAT CC) 30 MG CR tablet, Take 30 mg by mouth daily., Disp: , Rfl:   ??? aspirin 81 MG tablet, Take 81 mg by mouth daily., Disp: , Rfl:      Review of patient's allergies indicates no known allergies.        Social History   Substance Use Topics   ??? Smoking status: Former Smoker   ?? ?? Quit date: 03/03/1988   ??? Smokeless tobacco: Never Used   ??? Alcohol use No   ??   Family??History           Family History   Problem Relation Age of Onset   ??? Diabetes Mother ??   ??? Cancer Father ??   ?? ?? Prostate Cancer      ??  ??  Review of Systems   Constitutional: Negative for chills and fever.   HENT: Negative for ear discharge and hearing loss.   Eyes: Negative for blurred vision and double vision.   Respiratory: Negative for cough and shortness  of breath.   Cardiovascular: Negative for chest pain and palpitations.   Gastrointestinal: Negative for heartburn and vomiting.   Skin: Negative for itching and rash.   Neurological: Negative for dizziness, tingling and headaches.   Endo/Heme/Allergies: Negative for environmental allergies. Does not bruise/bleed easily.   All other systems reviewed and are negative.  ??  ??  BP 143/80 Pulse 64 Ht  (1.88 m) Wt 247 lb (112 kg) BMI 31.71 kg/m2  Physical Exam   Constitutional: He appears well-developed and well-nourished.   Eyes: EOM are normal. Pupils are equal, round, and reactive to light.   Neck: Normal range of motion. No tracheal deviation present. Thyromegaly present.   Large palpable thyroid nodule   Cardiovascular: Normal rate and intact distal pulses.   Pulmonary/Chest: Effort normal. No respiratory distress.   Musculoskeletal: Normal range of motion. He exhibits no edema.   Lymphadenopathy:   He has no cervical adenopathy.  Neurological: He is alert. No cranial nerve deficit.   Skin: Skin is warm. No erythema.   Nursing note and vitals reviewed.  ??  ??  IMPRESSION/PLAN:  Patient presents a 4.8 cm left-sided thyroid nodule inconclusive for malignancy. Recommendations are for a left-sided thyroidectomy versus total thyroidectomy. Given the fact of include conclusive results as well as patient's risk factors and size recommendation is for a thyroidectomy. Risks and benefits including bleeding infection recurrent laryngeal nerve injury hoarseness and permanent loss of voice with possible tracheostomy were all reviewed. Patient will follow up with him in 1 week,  ??  Dr. Jilda PandaJared D. Samaiyah Howes D.O. Ms. Renae FickleEd.  Otolaryngology Facial Plastic Surgery  Program Director: [facility]/Facial [facility]astic Surgery Residency  Associate Clinical Professor: Edd FabianLECOM, [employer], [employer]  [facility]  ??

## 2015-02-19 NOTE — Progress Notes (Signed)
Received call from Dr Priscille HeidelbergBunevich's office, requests PACU notify pt daughter, Maureen RalphsCourtney Sellers [phone] of post op status.  Verified with pt ok to notify Maureen Ralphsourtney Sellers postop.

## 2015-02-20 ENCOUNTER — Inpatient Hospital Stay: Admit: 2015-02-20 | Payer: MEDICARE | Attending: Facial Plastic Surgery | Primary: Geriatric Medicine

## 2015-02-20 DIAGNOSIS — Z01818 Encounter for other preprocedural examination: Secondary | ICD-10-CM

## 2015-02-20 LAB — BASIC METABOLIC PANEL
Anion Gap: 10 mmol/L (ref 7–16)
BUN: 12 mg/dL (ref 8–23)
CO2: 26 mmol/L (ref 22–29)
Calcium: 9.1 mg/dL (ref 8.6–10.2)
Chloride: 102 mmol/L (ref 98–107)
Creatinine: 0.7 mg/dL (ref 0.7–1.2)
GFR African American: >60
GFR Non-African American: >60 mL/min/{1.73_m2} (ref >=60)
Glucose: 108 mg/dL (ref 74–109)
Potassium: 5.3 mmol/L — ABNORMAL HIGH (ref 3.5–5.0)
Sodium: 138 mmol/L (ref 132–146)

## 2015-02-20 LAB — POTASSIUM: Potassium: 4.7 mmol/L (ref 3.5–5.0)

## 2015-02-20 LAB — CALCIUM: Calcium: 8.4 mg/dL — ABNORMAL LOW (ref 8.6–10.2)

## 2015-02-20 LAB — CBC
Hematocrit: 41.1 % (ref 37.0–54.0)
Hemoglobin: 13.3 g/dL (ref 12.5–16.5)
MCH: 30.6 pg (ref 26.0–35.0)
MCHC: 32.5 % (ref 32.0–34.5)
MCV: 94.1 fL (ref 80.0–99.9)
MPV: 9.3 fL (ref 7.0–12.0)
Platelets: 181 E9/L (ref 130–450)
RBC: 4.36 E12/L (ref 3.80–5.80)
RDW: 11.8 fL (ref 11.5–15.0)
WBC: 6.7 E9/L (ref 4.5–11.5)

## 2015-02-20 MED ORDER — ACETAMINOPHEN 325 MG PO TABS
325 MG | ORAL | Status: DC | PRN
Start: 2015-02-20 — End: 2015-02-21

## 2015-02-20 MED ORDER — HYDROMORPHONE HCL 1 MG/ML IJ SOLN
1 MG/ML | INTRAMUSCULAR | Status: DC | PRN
Start: 2015-02-20 — End: 2015-02-20

## 2015-02-20 MED ORDER — HYDROCODONE-ACETAMINOPHEN 5-325 MG PO TABS
5-325 MG | ORAL | Status: DC | PRN
Start: 2015-02-20 — End: 2015-02-21

## 2015-02-20 MED ORDER — NORMAL SALINE FLUSH 0.9 % IV SOLN
0.9 % | Freq: Two times a day (BID) | INTRAVENOUS | Status: DC
Start: 2015-02-20 — End: 2015-02-20

## 2015-02-20 MED ORDER — BACITRACIN ZINC 500 UNIT/GM EX OINT
500 UNIT/GM | CUTANEOUS | Status: AC
Start: 2015-02-20 — End: ?

## 2015-02-20 MED ORDER — MORPHINE SULFATE (PF) 2 MG/ML IV SOLN
2 MG/ML | INTRAVENOUS | Status: DC | PRN
Start: 2015-02-20 — End: 2015-02-21

## 2015-02-20 MED ORDER — NORMAL SALINE FLUSH 0.9 % IV SOLN
0.9 % | Freq: Two times a day (BID) | INTRAVENOUS | Status: DC
Start: 2015-02-20 — End: 2015-02-21

## 2015-02-20 MED ORDER — PROMETHAZINE HCL 25 MG/ML IJ SOLN
25 MG/ML | INTRAMUSCULAR | Status: DC | PRN
Start: 2015-02-20 — End: 2015-02-20

## 2015-02-20 MED ORDER — NORMAL SALINE FLUSH 0.9 % IV SOLN
0.9 % | INTRAVENOUS | Status: DC | PRN
Start: 2015-02-20 — End: 2015-02-21

## 2015-02-20 MED ORDER — METFORMIN HCL 500 MG PO TABS
500 MG | Freq: Two times a day (BID) | ORAL | Status: DC
Start: 2015-02-20 — End: 2015-02-21
  Administered 2015-02-21: 12:00:00 500 mg via ORAL

## 2015-02-20 MED ORDER — FENTANYL CITRATE (PF) 100 MCG/2ML IJ SOLN
100 MCG/2ML | INTRAMUSCULAR | Status: DC | PRN
Start: 2015-02-20 — End: 2015-02-20

## 2015-02-20 MED ORDER — NORMAL SALINE FLUSH 0.9 % IV SOLN
0.9 % | INTRAVENOUS | Status: DC | PRN
Start: 2015-02-20 — End: 2015-02-20

## 2015-02-20 MED ORDER — LEVOTHYROXINE SODIUM 200 MCG PO TABS
200 MCG | Freq: Every day | ORAL | Status: DC
Start: 2015-02-20 — End: 2015-02-21
  Administered 2015-02-21: 12:00:00 200 ug via ORAL

## 2015-02-20 MED ORDER — SODIUM CHLORIDE 0.9 % IV SOLN
0.9 % | INTRAVENOUS | Status: DC
Start: 2015-02-20 — End: 2015-02-20
  Administered 2015-02-20: 16:00:00 via INTRAVENOUS

## 2015-02-20 MED ORDER — DIPHENHYDRAMINE HCL 50 MG/ML IJ SOLN
50 MG/ML | Freq: Once | INTRAMUSCULAR | Status: DC | PRN
Start: 2015-02-20 — End: 2015-02-20

## 2015-02-20 MED ORDER — LIDOCAINE-EPINEPHRINE 1 %-1:100000 IJ SOLN
1 %-:00000 | INTRAMUSCULAR | Status: AC
Start: 2015-02-20 — End: ?

## 2015-02-20 MED ORDER — HYDROMORPHONE HCL 1 MG/ML IJ SOLN
1 MG/ML | INTRAMUSCULAR | Status: DC | PRN
Start: 2015-02-20 — End: 2015-02-20
  Administered 2015-02-20 (×2): 0.5 mg via INTRAVENOUS

## 2015-02-20 MED ORDER — ONDANSETRON HCL 4 MG/2ML IJ SOLN
4 MG/2ML | Freq: Four times a day (QID) | INTRAMUSCULAR | Status: DC | PRN
Start: 2015-02-20 — End: 2015-02-21

## 2015-02-20 MED ORDER — MEPERIDINE HCL 25 MG/ML IJ SOLN
25 MG/ML | INTRAMUSCULAR | Status: DC | PRN
Start: 2015-02-20 — End: 2015-02-20

## 2015-02-20 MED ORDER — OXYCODONE-ACETAMINOPHEN 5-325 MG PO TABS
5-325 MG | Freq: Once | ORAL | Status: DC | PRN
Start: 2015-02-20 — End: 2015-02-20

## 2015-02-20 MED ORDER — MORPHINE SULFATE (PF) 4 MG/ML IV SOLN
4 MG/ML | INTRAVENOUS | Status: DC | PRN
Start: 2015-02-20 — End: 2015-02-21

## 2015-02-20 MED ORDER — KCL IN DEXTROSE-NACL 20-5-0.45 MEQ/L-%-% IV SOLN
INTRAVENOUS | Status: DC
Start: 2015-02-20 — End: 2015-02-21
  Administered 2015-02-21 (×2): via INTRAVENOUS

## 2015-02-20 MED ORDER — LISINOPRIL 10 MG PO TABS
10 MG | Freq: Every day | ORAL | Status: DC
Start: 2015-02-20 — End: 2015-02-21
  Administered 2015-02-21 (×2): 10 mg via ORAL

## 2015-02-20 MED FILL — FENTANYL CITRATE (PF) 250 MCG/5ML IJ SOLN: 250 MCG/5ML | INTRAMUSCULAR | Qty: 5

## 2015-02-20 MED FILL — LIDOCAINE-EPINEPHRINE 1 %-1:100000 IJ SOLN: 1 %-:00000 | INTRAMUSCULAR | Qty: 20

## 2015-02-20 MED FILL — HYDROMORPHONE HCL 1 MG/ML IJ SOLN: 1 MG/ML | INTRAMUSCULAR | Qty: 0.5

## 2015-02-20 MED FILL — MIDAZOLAM HCL 2 MG/2ML IJ SOLN: 2 MG/ML | INTRAMUSCULAR | Qty: 0.5

## 2015-02-20 MED FILL — BACITRACIN ZINC 500 UNIT/GM EX OINT: 500 UNIT/GM | CUTANEOUS | Qty: 28.35

## 2015-02-20 MED FILL — LEVOTHYROXINE SODIUM 200 MCG PO TABS: 200 MCG | ORAL | Qty: 1

## 2015-02-20 NOTE — Brief Op Note (Signed)
Brief Postoperative Note    Adria DillCarl H Deeds  Date of Birth:  06/30/1946  [id number]    Pre-operative Diagnosis: Left thyroid nodule with indeterminate FNA    Post-operative Diagnosis: Same    Procedure: total thyroidectomy with NIM    Anesthesia: General    Surgeons/Assistants: Leola BrazilBunevich, Shaneeka Scarboro    Estimated Blood Loss: less than 50     Complications: None    Specimens: Was Obtained: Left and right thyroid lobes     Findings: see operative report    Electronically signed by Mayme GentaStephen M Tashya Alberty, DO on 02/20/2015 at 5:36 PM

## 2015-02-20 NOTE — Interval H&P Note (Signed)
H&P Reviewed. No changes. Questions answered. Explained he may have a drain after surgery.    Electronically signed by Mayme GentaStephen M Yechezkel Fertig, DO on 02/20/15 at 12:22 PM

## 2015-02-20 NOTE — Progress Notes (Signed)
To stage 1 recovery from OR, patient opens eyes, follows commands, able to speak, symmetrical smile and eyebrow raises, tongue midline. Incision to lower throat, well approximates with sutures, bacitracin intact, no drainage noted, JP drain noted posteriorly to incision draining bloody drainage, Calcium level drawn and sent to lab, Dr. Bayard MalesBunevich to see patient at bedside, PCD's

## 2015-02-20 NOTE — Anesthesia Pre-Procedure Evaluation (Signed)
Department of Anesthesiology  Preprocedure Note       Name:  Donald Jackson   Age:  69 y.o.  DOB:  1946/06/10                                          MRN:  [id number]         Date:  02/20/2015      Surgeon:  Bayard Males    Procedure:  Total thyroidectomy    Medications prior to admission:   Prior to Admission medications    Medication Sig Start Date End Date Taking? Authorizing Provider   metFORMIN (GLUCOPHAGE) 500 MG tablet Take 500 mg by mouth 2 times daily (with meals)   Yes Historical Provider, MD   lisinopril (PRINIVIL;ZESTRIL) 10 MG tablet Take 1 tablet by mouth daily 10/26/14  Yes Edwin Cap, DO   rosuvastatin (CRESTOR) 10 MG tablet TAKE 1 TABLET DAILY 10/26/14  Yes Edwin Cap, DO   aspirin 81 MG tablet Take 81 mg by mouth daily Last dose 02/10/2015    Historical Provider, MD       Current medications:    Current Outpatient Prescriptions   Medication Sig Dispense Refill   ??? metFORMIN (GLUCOPHAGE) 500 MG tablet Take 500 mg by mouth 2 times daily (with meals)     ??? lisinopril (PRINIVIL;ZESTRIL) 10 MG tablet Take 1 tablet by mouth daily 30 tablet 3   ??? rosuvastatin (CRESTOR) 10 MG tablet TAKE 1 TABLET DAILY 90 tablet 3   ??? aspirin 81 MG tablet Take 81 mg by mouth daily Last dose 02/10/2015       Current Facility-Administered Medications   Medication Dose Route Frequency Provider Last Rate Last Dose   ??? sodium chloride flush 0.9 % injection 10 mL  10 mL Intravenous 2 times per day Jared D Bunevich, DO       ??? sodium chloride flush 0.9 % injection 10 mL  10 mL Intravenous PRN Almyra Brace Bunevich, DO       ??? 0.9 % sodium chloride infusion   Intravenous Continuous Almyra Brace Bunevich, DO 100 mL/hr at 02/20/15 1223         Allergies:  No Known Allergies    Problem List:  There is no problem list on file for this patient.      Past Medical History:        Diagnosis Date   ??? DM (diabetes mellitus) (HCC)      not on any medications   ??? Hyperlipidemia    ??? Hypertension    ??? Thyroid disease        Past Surgical History:  History  reviewed. No pertinent past surgical history.    Social History:    Social History   Substance Use Topics   ??? Smoking status: Former Smoker     Quit date: 03/03/1988   ??? Smokeless tobacco: Never Used   ??? Alcohol use No                                Counseling given: Not Answered      Vital Signs (Current):   Vitals:    02/14/15 0928 02/20/15 1154   BP:  180/84   Pulse:  66   Resp:  20   Temp:  97.9 ??F (36.6 ??C)  TempSrc:  Temporal   SpO2:  99%   Weight: 247 lb (112 kg)    Height: 6\' 2"  (1.88 m)                                               BP Readings from Last 3 Encounters:   02/20/15 180/84   01/12/15 143/80   12/15/14 133/85       NPO Status: Time of last liquid consumption: 2340                        Time of last solid consumption: 2340                        Date of last liquid consumption: 02/19/15                        Date of last solid food consumption: 02/19/15    BMI:   Wt Readings from Last 3 Encounters:   02/14/15 247 lb (112 kg)   01/12/15 247 lb (112 kg)   12/15/14 250 lb 1.9 oz (113.5 kg)     Body mass index is 31.71 kg/(m^2).    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             Anesthesia Plan    ASA 3     general     intravenous induction   Anesthetic plan and risks discussed with patient.    Plan discussed with CRNA.            Jiles HaroldBijo J Navaya Wiatrek, MD   02/20/2015

## 2015-02-20 NOTE — Progress Notes (Signed)
Patient verbalized pain medication helped with pain, is better

## 2015-02-20 NOTE — Progress Notes (Signed)
Patient resting quietly, report called to [id number], RN

## 2015-02-20 NOTE — Op Note (Signed)
Donald Jackson, Kumar H                               [id number]      DATE OF PROCEDURE:  02/20/2015      SURGEON:  Johnsie CancelJARED D. Dora Simeone, D.O.      ASSISTANWestley Hummer:  S. Reynolds, D.O.      PREOPERATIVE DIAGNOSIS:  Left thyroid nodule suspicious for malignancy      POSTOPERATIVE DIAGNOSIS:  Left thyroid nodule suspicious for malignancy      OPERATION:  Total thyroidectomy      ANESTHESIA:      ESTIMATED BLOOD LOSS:  Less than 50 mL.      COMPLICATIONS:  None.      DESCRIPTION OF PROCEDURE:  The patient identified by myself in the   preoperative room setting.  Questions were addressed.  The patient was   appropriately marked and brought back to the operating room suite.  Once in   the operating room suite, general anesthesia was induced.  The patient was   appropriately positioned.  He was then connected to the nerve integrity   monitoring system.  He was injected with 1% lidocaine with epinephrine 2   fingerbreadths above the level of the sternal notch for a total of 6 mL.  He   was then prepped and draped in normal sterile fashion.      A #15 blade was used to make an incision in the skin and dermis, carried down   to the level of the subcutaneous fat.  A PEAK PlasmaBlade was then used to   raise superior and inferior subplatysmal skin flaps.  These were retained   using 2-0 silk tiebacks.  Next, the strap muscles were divided in the midline   and, using retraction, the left thyroid lobe was identified and bluntly   dissected using a combination of cold dissection and blunt dissection with   Kittner's.  Once the superior, middle and inferior poles were identified, the   superior pole was skeletonized.  The vascular bundle and vascular were   identified.  The parathyroid complex was identified.  The Harmonic scalpel   was used to remove the superior thyroid artery and vein and then retracted   inferiorly.  Next, the inferior pole in a similar fashion was rotated   medially over the trachea itself.  Note it had a dense adhesive  capsule   throughout the left-side thyroid gland that did not allow for a significant   amount of motion.  A Harmonic scalpel was used to remove the inferior pole as   well and then the thyroid gland was rotated medially on top of the trachea.   Blunt dissection was again carried out.  The middle vein was never grossly   identified.  There was a significant amount of netting and small vasculatures   on the posterior aspect with a dense nodule that was hard to palpation.  The   recurrent laryngeal nerve was identified deep to the space and was dissected   bluntly using Kittner's.  A combination of blunt dissection with McCabe   forceps and Harmonic scalpel was then carried out and the left lobe of the   thyroid was dissected onto the anterior surface of the trachea.  Once the   left thyroid lobe was fully mobilized, attention was turned to the right   gland.      Again, this was a densely adherent,  ill-mobile thyroid gland with a   significant amount of scar tissue as well as inflammatory process and small   vasculature that was part of a net-like encapsulation of the thyroid gland.   The superior pole was identified.  It was taken down using blunt dissection,   followed by the Harmonic scalpel.  In a similar fashion, the inferior pole   was identified.  The inferior parathyroid complex was bluntly dissected away.   The Harmonic scalpel was used to mobilize the inferior pole.  The thyroid   gland was then rotated medially onto the anterior surface of the trachea.   Blunt dissection was carried through and the recurrent laryngeal nerve was   identified below the level of the insertion of the cricoid notch.  A small   portion of thyroid gland was sacrificed, less than 10%, to remain to protect   the recurrent laryngeal nerve on this side due the dense adherent nature as   well as a significant amount of vascular netting that was surrounded by the   posterior surface and Berry's ligament.  The gland was then rotated  medially   on the anterior surface of the trachea and then dissected free.  The glands   were sent as separate specimens labeled, "left thyroid gland" and "right   thyroid gland," each representing approximately 50% of the isthmus.  The   patient was then Valsalva'd to 40 mmHg.  Any remaining bleeding was   controlled using bipolar cautery.  One g of Arista was placed, followed by a   7-0 flat drain.  The strap muscles were closed in interrupted fashion using   3-0 Vicryl.  Then the platysmal layers were closed in interrupted fashion   using 3-0 Vicryl.  The skin incision was closed using vertical mattress   stitches of a 4-0 Prolene and the drain was sewn in place with a 4-0 nylon.   The patient was then cleaned with sterile wet towels and dried.  Antibiotic   ointment was placed as a natural dressing.  Care was then given back to   Anesthesia for arousal.      DISPOSITION:  Went to the floor for continued observation.      CONDITION:  Stable.            Dictated by:  Lanier Ensign, D.O.            Dorma Russell / nmt   DD: 02/20/2015   05:40 P    DT: 02/21/2015 12:25 A   [id number]    [id number]   CC:  Johnsie Cancel, D.O.

## 2015-02-21 LAB — COMPREHENSIVE METABOLIC PANEL
ALT: 20 U/L (ref 0–40)
AST: 17 U/L (ref 0–39)
Albumin: 3.5 g/dL (ref 3.5–5.2)
Alkaline Phosphatase: 68 U/L (ref 40–129)
Anion Gap: 11 mmol/L (ref 7–16)
BUN: 10 mg/dL (ref 8–23)
CO2: 24 mmol/L (ref 22–29)
Calcium: 8.7 mg/dL (ref 8.6–10.2)
Chloride: 103 mmol/L (ref 98–107)
Creatinine: 0.7 mg/dL (ref 0.7–1.2)
GFR African American: >60
GFR Non-African American: >60 mL/min/{1.73_m2} (ref >=60)
Glucose: 153 mg/dL — ABNORMAL HIGH (ref 74–109)
Potassium: 4.7 mmol/L (ref 3.5–5.0)
Sodium: 138 mmol/L (ref 132–146)
Total Bilirubin: 0.2 mg/dL (ref 0.0–1.2)
Total Protein: 6.7 g/dL (ref 6.4–8.3)

## 2015-02-21 LAB — PTH, INTACT: PTH: 56 pg/mL (ref 15–65)

## 2015-02-21 LAB — CBC
Hematocrit: 37.1 % (ref 37.0–54.0)
Hemoglobin: 12 g/dL — ABNORMAL LOW (ref 12.5–16.5)
MCH: 31.2 pg (ref 26.0–35.0)
MCHC: 32.3 % (ref 32.0–34.5)
MCV: 96.6 fL (ref 80.0–99.9)
MPV: 9.8 fL (ref 7.0–12.0)
Platelets: 190 E9/L (ref 130–450)
RBC: 3.84 E12/L (ref 3.80–5.80)
RDW: 13 fL (ref 11.5–15.0)
WBC: 11.1 E9/L (ref 4.5–11.5)

## 2015-02-21 LAB — CALCIUM: Calcium: 8.7 mg/dL (ref 8.6–10.2)

## 2015-02-21 MED ORDER — MORPHINE SULFATE (PF) 2 MG/ML IV SOLN
2 MG/ML | Freq: Three times a day (TID) | INTRAVENOUS | Status: DC | PRN
Start: 2015-02-21 — End: 2015-02-21
  Administered 2015-02-21: 12:00:00 2 mg via INTRAVENOUS

## 2015-02-21 MED ORDER — METFORMIN HCL 500 MG PO TABS
500 MG | ORAL_TABLET | Freq: Two times a day (BID) | ORAL | 1 refills | Status: DC
Start: 2015-02-21 — End: 2015-02-27

## 2015-02-21 MED ORDER — MORPHINE SULFATE (PF) 4 MG/ML IV SOLN
4 MG/ML | Freq: Three times a day (TID) | INTRAVENOUS | Status: DC | PRN
Start: 2015-02-21 — End: 2015-02-21

## 2015-02-21 MED ORDER — HYDROCODONE-ACETAMINOPHEN 5-325 MG PO TABS
5-325 MG | ORAL_TABLET | Freq: Four times a day (QID) | ORAL | 0 refills | Status: DC | PRN
Start: 2015-02-21 — End: 2015-09-13

## 2015-02-21 MED ORDER — LEVOTHYROXINE SODIUM 100 MCG PO TABS
100 MCG | ORAL_TABLET | Freq: Every day | ORAL | 3 refills | Status: DC
Start: 2015-02-21 — End: 2015-04-10

## 2015-02-21 MED ORDER — METFORMIN HCL 500 MG PO TABS
500 MG | ORAL_TABLET | Freq: Two times a day (BID) | ORAL | 1 refills | Status: AC
Start: 2015-02-21 — End: ?

## 2015-02-21 MED ORDER — LEVOTHYROXINE SODIUM 100 MCG PO TABS
100 MCG | ORAL_TABLET | Freq: Every day | ORAL | 3 refills | Status: DC
Start: 2015-02-21 — End: 2016-01-15

## 2015-02-21 MED FILL — METFORMIN HCL 500 MG PO TABS: 500 MG | ORAL | Qty: 1

## 2015-02-21 MED FILL — LEVOTHYROXINE SODIUM 200 MCG PO TABS: 200 MCG | ORAL | Qty: 1

## 2015-02-21 MED FILL — SOJOURN IN SOLN: RESPIRATORY_TRACT | Qty: 250

## 2015-02-21 MED FILL — KCL IN DEXTROSE-NACL 20-5-0.45 MEQ/L-%-% IV SOLN: INTRAVENOUS | Qty: 1000

## 2015-02-21 MED FILL — DEXAMETHASONE SODIUM PHOSPHATE 20 MG/5ML IJ SOLN: 20 MG/5ML | INTRAMUSCULAR | Qty: 5

## 2015-02-21 MED FILL — DIPRIVAN 500 MG/50ML IV EMUL: 500 MG/50ML | INTRAVENOUS | Qty: 50

## 2015-02-21 MED FILL — LISINOPRIL 10 MG PO TABS: 10 MG | ORAL | Qty: 1

## 2015-02-21 MED FILL — MORPHINE SULFATE (PF) 2 MG/ML IV SOLN: 2 mg/mL | INTRAVENOUS | Qty: 1

## 2015-02-21 MED FILL — ONDANSETRON HCL 4 MG/2ML IJ SOLN: 4 MG/2ML | INTRAMUSCULAR | Qty: 2

## 2015-02-21 MED FILL — LIDOCAINE HCL (CARDIAC) 20 MG/ML IV SOLN: 20 MG/ML | INTRAVENOUS | Qty: 5

## 2015-02-21 NOTE — Progress Notes (Signed)
Department of Otolaryngology  Attending Progress Note      SUBJECTIVE:  Pt seen on floor, voice is strong, pain is controlled, no swelling and tolerating PO    OBJECTIVE      Physical  VITALS:    Visit Vitals   ??? BP 123/67   ??? Pulse 63   ??? Temp 97.8 ??F (36.6 ??C) (Oral)   ??? Resp 18   ??? Ht 6\' 2"  (1.88 m)   ??? Wt 247 lb (112 kg)   ??? SpO2 98%   ??? BMI 31.71 kg/m2     DRAIN/TUBE OUTPUT:  Closed/Suction Drain Left Other (Comment) Bulb-Output (ml): 30 ml  CONSTITUTIONAL:  awake, alert, cooperative, no apparent distress, and appears stated age  EYES:  Lids and lashes normal, pupils equal, round and reactive to light, extra ocular muscles intact, sclera clear, conjunctiva normal  ENT:  normocepalic, without obvious abnormality, atraumatic, Nares normal, septum midline, no lesions, no drainage, lips normal  NECK:  Incision C/D/I, supple, symmetrical, trachea midline, skin normal and no stridor  Data  Calcium:    Lab Results   Component Value Date    CALCIUM 8.7 02/21/2015     Ionized Calcium:  No results found for: IONCA  Current Inpatient Medications  Current Facility-Administered Medications: morphine (PF) injection 2 mg, 2 mg, Intravenous, Q8H PRN **OR** morphine (PF) injection 4 mg, 4 mg, Intravenous, Q8H PRN  lisinopril (PRINIVIL;ZESTRIL) tablet 10 mg, 10 mg, Oral, Daily  metFORMIN (GLUCOPHAGE) tablet 500 mg, 500 mg, Oral, BID WC  sodium chloride flush 0.9 % injection 10 mL, 10 mL, Intravenous, 2 times per day  sodium chloride flush 0.9 % injection 10 mL, 10 mL, Intravenous, PRN  acetaminophen (TYLENOL) tablet 650 mg, 650 mg, Oral, Q4H PRN  ondansetron (ZOFRAN) injection 4 mg, 4 mg, Intravenous, Q6H PRN  dextrose 5 % and 0.45 % NaCl with KCl 20 mEq infusion, , Intravenous, Continuous  HYDROcodone-acetaminophen (NORCO) 5-325 MG per tablet 1 tablet, 1 tablet, Oral, Q4H PRN **OR** HYDROcodone-acetaminophen (NORCO) 5-325 MG per tablet 2 tablet, 2 tablet, Oral, Q4H PRN  levothyroxine (SYNTHROID) tablet 200 mcg, 200 mcg, Oral,  Daily  ASSESSMENT AND PLAN    S/P Total thyroidectomy and doing well, plan d/c home and follow up in 1 week with ENT for suture removal.

## 2015-02-21 NOTE — Progress Notes (Signed)
OTOLARYNGOLOGY  DAILY PROGRESS NOTE  02/21/2015    Subjective:     Patient is doing well today. He is tolerating diet, swallowing ok, complains of some pain with swallowing. Feels voice quality is good. Pain controlled. No issues breathing. Afebrile over night. O2 sats good overnight. Questions answered.    Objective:     Visit Vitals   ??? BP 117/66   ??? Pulse 67   ??? Temp 98.4 ??F (36.9 ??C) (Oral)   ??? Resp 18   ??? Ht 6\' 2"  (1.88 m)   ??? Wt 247 lb (112 kg)   ??? SpO2 96%   ??? BMI 31.71 kg/m2     PULSE OXIMETRY RANGE: SpO2  Avg: 98.9 %  Min: 96 %  Max: 100 %  I/O last 3 completed shifts:  In: 2380 [P.O.:480; I.V.:1900]  Out: 5685 [Drains:65; Blood:20]    GENERAL:  Laying in bed, awake, alert, cooperative, no apparent distress  HENT: Normocephalic, atraumatic. Incision s/p thyroidectomy C/D/I, dressed with bacitracin. No signs of hematoma or infection. JP drain with minimal SS this am. 30 cc last shift.  LUNGS:  No increased work of breathing, no stridor, voice strong  CARDIOVASCULAR:  RR    CBC  Recent Labs      02/20/15   1210  02/21/15   0550   WBC  6.7  11.1   HGB  13.3  12.0*   HCT  41.1  37.1   PLT  181  190     BMP  Recent Labs      02/21/15   0550   NA  138   K  4.7   CL  103   CO2  24   BUN  10   CREATININE  0.7   CALCIUM  8.7     PT-INR  No results for input(s): INR, PTT in the last 72 hours.    Invalid input(s): PT  CALCIUM  Recent Labs      02/20/15   1803  02/21/15   0000  02/21/15   0550   CALCIUM  8.4*  8.7  8.7       Assessment/Plan:     69 y.o. male POD1 S/P total thyroidectomy for indeterminate FNA    -switch to PO pain medication  -synthroid 200mcg  -await PTH  -calciums trending up.  -increase diet as tolerated  -up to chair  -pull drain later today  -DC later today    Electronically signed by Mayme GentaStephen M Adali Pennings, DO on 02/21/2015 at 6:37 AM

## 2015-02-21 NOTE — Other (Signed)
[facility] Quality Flow/Interdisciplinary Rounds Progress Note        Quality Flow Rounds held on February 21, 2015    Disciplines Attending:  Bedside Nurse, Social Worker, Case Manager and Nursing Unit Leadership    Adria DillCarl H Seier was admitted on 02/20/2015  1:53 PM    Anticipated Discharge Date:       Disposition:    Braden Score:  Braden Scale Score: 21    Readmission Risk              Readmission Risk:        2.25       Age 865 or Greater:  1    Admitted from SNF or Requires Paid or Family Care:  0    Currently has CHF,COPD,ARF,CRI,or is on dialysis:  0    Takes more than 5 Prescription Medications:  0    Takes Digoxin,Insulin,Anticoagulants,Narcotics or ASA/Plavix:  0    Hospital Admit in Past 12 Months:  0    On Disability:  0    Patient Considers own Health:  1.25          Discussed patient goal for the day, patient clinical progression, and barriers to discharge.  The following Goal(s) of the Day/Commitment(s) have been identified:  Discharge - Obtain Order      Ginny ForthKathleen Hector Venne  February 21, 2015

## 2015-02-21 NOTE — Plan of Care (Signed)
Problem: Falls - Risk of  Goal: Absence of falls  Outcome: Met This Shift

## 2015-02-21 NOTE — Other (Addendum)
Patient Acct Nbr:  [id number]  Primary AUTH/CERT:    Primary Insurance Company Name:   MEDICARE  Primary Insurance Plan Name:  MEDICARE  Primary Insurance Group Number:    Primary [facility] Plan Type: [facility]  Primary Insurance Policy Number:  [id number]    Secondary AUTH/CERT:    Secondary Insurance Company Name:   MEDICARE(NON-HMO)SUPPLEMENTAL  Secondary Insurance Plan Name:  ANTHEM  Secondary Insurance Group Number:  [id number]  Secondary Insurance Plan Type: B  Secondary Insurance Policy Number:  [id number]

## 2015-02-27 ENCOUNTER — Ambulatory Visit
Admit: 2015-02-27 | Discharge: 2015-02-27 | Payer: MEDICARE | Attending: Facial Plastic Surgery | Primary: Geriatric Medicine

## 2015-02-27 DIAGNOSIS — E890 Postprocedural hypothyroidism: Secondary | ICD-10-CM

## 2015-02-27 NOTE — Patient Instructions (Addendum)
Continue the antibiotic ointment for 1 more week, then, use mederma scar cream for 6 weeks.    Try [facility] on [address] [place], or Dr.[place]yeart in [place]. [phone]

## 2015-02-27 NOTE — Progress Notes (Signed)
POD # 7    S: Pt doing well, voice strong, tolerating PO    O: Incision C/D/I, no hematoma, voice strong, pathology reviewed    A: S/P total thyroidectomy-benign pathology    P: Patient is doing well, voice is strong, incision is clean dry and intact.  Patient will continue antibiotic ointment to the incision for 1 week followed by scar cream for another 6 weeks.  Repeat thyroid lab work in 6 weeks.

## 2015-04-10 ENCOUNTER — Ambulatory Visit
Admit: 2015-04-10 | Discharge: 2015-04-10 | Payer: MEDICARE | Attending: Facial Plastic Surgery | Primary: Geriatric Medicine

## 2015-04-10 DIAGNOSIS — E890 Postprocedural hypothyroidism: Secondary | ICD-10-CM

## 2015-04-10 NOTE — Progress Notes (Signed)
Postop    Subjective: Patient is doing well, denies any itching or drainage from the incision itself.  Patient states she is swallowing well, no changes in voice.  Patient states he feels he has adequate energy levels at this point, denies any associated weight gain or weight loss.  No changes in her repeat that he is aware of.    Objective: Incision clean dry intact some areas of possible hypertrophic extension recommended patient massage this area otherwise no associated changes in score itself.  TSH, T3, T4 all within normal limits.    Assessment: Status post total thyroidectomy for multinodular goiter    Plan: Patient is currently euthyroid on appropriate dose of Synthroid and continues dosage.  Patient will follow up from otolaryngology standpoint for 3 months or sooner with any new complaints.

## 2015-04-11 NOTE — Progress Notes (Signed)
Had done at [facility] office

## 2015-04-11 NOTE — Progress Notes (Signed)
In chart done at [facility] office

## 2015-04-11 NOTE — Progress Notes (Signed)
Done at [facility] office and in chart

## 2015-07-17 ENCOUNTER — Ambulatory Visit
Admit: 2015-07-17 | Discharge: 2015-07-17 | Payer: MEDICARE | Attending: Facial Plastic Surgery | Primary: Geriatric Medicine

## 2015-07-17 DIAGNOSIS — E039 Hypothyroidism, unspecified: Secondary | ICD-10-CM

## 2015-07-17 MED ORDER — LEVOTHYROXINE SODIUM 100 MCG PO TABS
100 MCG | ORAL_TABLET | Freq: Every day | ORAL | 5 refills | Status: DC
Start: 2015-07-17 — End: 2015-09-13

## 2015-07-17 NOTE — Progress Notes (Signed)
[facility]  Dr. Jilda Panda D. Bayard Males, D.O. Ms.Ed        Patient Name:  Donald Jackson  DOB:  November 19, 1945     CHIEF C/O:    Chief Complaint   Patient presents with   ??? Post-Op Check     thyroidectomy- doing good       HISTORY OBTAINED FROM:  patient    HISTORY OF PRESENT ILLNESS:       [name] is a 69 y.o. year old male, here today for follow up of history of total thyroidectomy in June 2016.  Patient has been placed on 100 ??g of Synthroid, repeat thyroid laboratory findings are examined.  Patient denies any symptoms of fatigue, weight gain, dry or brittle nails.  Patient also denies any associated fevers chills headaches short of breath stridor or difficulty swallowing or voice changes.      Past Medical History   Diagnosis Date   ??? DM (diabetes mellitus) (HCC)      not on any medications   ??? Hyperlipidemia    ??? Hypertension    ??? Thyroid disease      Past Surgical History   Procedure Laterality Date   ??? Total thyroidectomy  02/20/2015       Current Outpatient Prescriptions:   ???  levothyroxine (LEVOTHROID) 100 MCG tablet, Take 1 tablet by mouth daily, Disp: 30 tablet, Rfl: 5  ???  gabapentin (NEURONTIN) 100 MG capsule, , Disp: , Rfl:   ???  vitamin D (ERGOCALCIFEROL) 50000 UNITS CAPS capsule, 1,000 Units , Disp: , Rfl:   ???  NIFEdipine (ADALAT CC) 30 MG CR tablet, , Disp: , Rfl:   ???  levothyroxine (LEVOTHROID) 100 MCG tablet, Take 1 tablet by mouth daily, Disp: 30 tablet, Rfl: 3  ???  metFORMIN (GLUCOPHAGE) 500 MG tablet, Take 1 tablet by mouth 2 times daily (with meals), Disp: 60 tablet, Rfl: 1  ???  lisinopril (PRINIVIL;ZESTRIL) 10 MG tablet, Take 1 tablet by mouth daily, Disp: 30 tablet, Rfl: 3  ???  rosuvastatin (CRESTOR) 10 MG tablet, TAKE 1 TABLET DAILY, Disp: 90 tablet, Rfl: 3  ???  aspirin 81 MG tablet, Take 81 mg by mouth daily Last dose 02/10/2015, Disp: , Rfl:   ???  HYDROcodone-acetaminophen (NORCO) 5-325 MG per tablet, Take 1 tablet by mouth every 6 hours as needed, Disp: 25 tablet, Rfl: 0  Review of patient's  allergies indicates no known allergies.  Social History   Substance Use Topics   ??? Smoking status: Former Smoker     Quit date: 03/03/1988   ??? Smokeless tobacco: Never Used   ??? Alcohol use No     Family History   Problem Relation Age of Onset   ??? Diabetes Mother    ??? Cancer Father      Prostate Cancer       Review of Systems   Constitutional: Negative for chills and fever.   HENT: Negative for ear discharge and hearing loss.    Eyes: Negative for blurred vision and double vision.   Respiratory: Negative for cough and shortness of breath.    Cardiovascular: Negative for chest pain and palpitations.   Gastrointestinal: Negative for heartburn and vomiting.   Skin: Negative for itching and rash.   Neurological: Negative for dizziness, tingling and headaches.   Endo/Heme/Allergies: Negative for environmental allergies. Does not bruise/bleed easily.   All other systems reviewed and are negative.      BP 135/83 (Site: Left Arm, Position: Sitting, Cuff Size: Large  Adult)  Pulse 58  Ht 6\' 2"  (1.88 m)  Wt 244 lb (110.7 kg)  BMI 31.33 kg/m2  Physical Exam   Constitutional: He appears well-developed and well-nourished.   HENT:   Head: Normocephalic and atraumatic.   Right Ear: Hearing, tympanic membrane and ear canal normal.   Left Ear: Hearing, tympanic membrane and ear canal normal.   Nose: Mucosal edema and rhinorrhea present.   Mouth/Throat: Uvula is midline and mucous membranes are normal. Normal dentition. No dental abscesses or dental caries. No oropharyngeal exudate or posterior oropharyngeal erythema.   Eyes: EOM are normal. Pupils are equal, round, and reactive to light.   Neck: Normal range of motion. No tracheal deviation present. No thyromegaly present.   Cardiovascular: Normal rate and intact distal pulses.    Pulmonary/Chest: Effort normal. No respiratory distress.   Musculoskeletal: Normal range of motion. He exhibits no edema.   Lymphadenopathy:     He has no cervical adenopathy.   Neurological: He is alert. No  cranial nerve deficit.   Skin: Skin is warm. No erythema.   Nursing note and vitals reviewed.      IMPRESSION/PLAN:  Patient was seen and examined status post total thyroidectomy, he has a slightly elevated TSH with a normal T4 and is asymptomatic.  I recommend continuing current Synthroid dose with close follow-up, and repeat exam in 6 months.  If patient begins to experience more hypothyroid type symptoms, consider increasing dose to 112 ??g daily.    Dr. Jilda PandaJared D. Ranae Casebier D.O. Ms. Renae FickleEd.  Otolaryngology Facial Plastic Surgery  Program Director:  [facility]  Associate Clinical Professor:  Edd FabianLECOM, OUCOM, NEOMED  [facility]

## 2015-07-17 NOTE — Progress Notes (Signed)
Labs due in May of 2017

## 2015-09-13 ENCOUNTER — Ambulatory Visit
Admit: 2015-09-13 | Discharge: 2015-09-13 | Payer: MEDICARE | Attending: Physician Assistant | Primary: Geriatric Medicine

## 2015-09-13 DIAGNOSIS — R040 Epistaxis: Secondary | ICD-10-CM

## 2015-09-13 NOTE — Patient Instructions (Addendum)
Can take tape off tonight. Friday morning start to use over the counter Ocean Nasal Saline Spray 2-3 times a day or any other over the counter saline nasal spray in both nostrils.  After 3 days can use a small amount of bacitracin or neosporin once a day in the left nostril (after using saline.  ??  Do not blow your nose for the next 7-10 days. If you have to cough or sneeze open your mouth. The packing that was put into your nose today may or may not fall out on its own if packing does fall out it is not a problem.  ??  Do not do any heavy lifting,bending over or strenuous work, for example (cutting the grass,vacuuming). DO NOT touch, pick, or move your nose. Try to touch your nose as little as possible as this can restart the nosebleed.    ??  If your nosebleed starts up pinch the soft part of your nose for 7 minutes, if it does not stop, after 7 minutes pinch again for another 7 more minutes. If nosebleed continues after the 14 minutes, you can call our office during regular business hours [phone] and after business hours go to the nearest Emergency Department.

## 2015-09-13 NOTE — Progress Notes (Signed)
[facility]      Patient Name:  Donald Jackson  DOB:  [birth year]     CHIEF C/O:    Chief Complaint   Patient presents with   ??? Epistaxis     left side only, started last saturday, daily since saturday, gets it to stop at home, may have had one in the past, but, a long time ago       HISTORY OBTAINED FROM:  patient    HISTORY OF PRESENT ILLNESS:       Donald Jackson is a 70 y.o. male, here today due to epistaxis of the left. The patient has seen Dr. Bayard Males in the past due to history of thyroidectomy. The patient states that last Saturday he started to note dripping of blood of the left nostril. This lasted for about 10 minutes- to stop the nosebleed the patient just dabbed with a tissue or lays back. The has continued to occur daily. The patient has never been packed, cauterized, or presented to the emergency department.     No nosespray use. Patient takes aspirin daily- did not take yesterday but took today.     No oxygen use. Denies manipulation or trauma of the nose. One episode of epistaxis as a kid no other episodes.     No history of easy bleeding or bruising.     No humidifier use.    Went to PCP yesterday due to nosebleed, was given triple antibiotic ointment but did not start using yet.       Past Medical History   Diagnosis Date   ??? DM (diabetes mellitus) (HCC)      not on any medications   ??? Hyperlipidemia    ??? Hypertension    ??? Thyroid disease      Past Surgical History   Procedure Laterality Date   ??? Total thyroidectomy  02/20/2015       Current Outpatient Prescriptions:   ???  gabapentin (NEURONTIN) 100 MG capsule, , Disp: , Rfl:   ???  vitamin D (ERGOCALCIFEROL) 50000 UNITS CAPS capsule, 1,000 Units , Disp: , Rfl:   ???  NIFEdipine (ADALAT CC) 30 MG CR tablet, , Disp: , Rfl:   ???  levothyroxine (LEVOTHROID) 100 MCG tablet, Take 1 tablet by mouth daily, Disp: 30 tablet, Rfl: 3  ???  metFORMIN (GLUCOPHAGE) 500 MG tablet, Take 1 tablet by mouth 2 times daily (with meals), Disp: 60 tablet, Rfl: 1  ???  lisinopril  (PRINIVIL;ZESTRIL) 10 MG tablet, Take 1 tablet by mouth daily, Disp: 30 tablet, Rfl: 3  ???  rosuvastatin (CRESTOR) 10 MG tablet, TAKE 1 TABLET DAILY, Disp: 90 tablet, Rfl: 3  ???  aspirin 81 MG tablet, Take 81 mg by mouth daily Last dose 02/10/2015, Disp: , Rfl:   Review of patient's allergies indicates no known allergies.  Social History   Substance Use Topics   ??? Smoking status: Former Smoker     Quit date: 03/03/1988   ??? Smokeless tobacco: Never Used   ??? Alcohol use No     Family History   Problem Relation Age of Onset   ??? Diabetes Mother    ??? Cancer Father      Prostate Cancer       Review of Systems   Constitutional: Negative for chills and fever.   HENT: Positive for nosebleeds. Negative for congestion, ear pain and tinnitus.    Eyes: Negative.    Respiratory: Negative.    Cardiovascular: Negative.    Gastrointestinal: Negative.  Neurological: Negative for dizziness and headaches.   Endo/Heme/Allergies: Negative for environmental allergies. Does not bruise/bleed easily.   All other systems reviewed and are negative.      BP (!) 152/84  Pulse 65  Ht  (1.88 m)  Wt 245 lb (111.1 kg)  BMI 31.46 kg/m2  Physical Exam    Patient was noted today to have a varicosity associated with left nasal septum. Nose was decongested and anesthetized with Afrin and lidocaine, and set approximately 7-10 minutes. Patient underwent nasal cautery followed by fibrillar packing for reinforcement.      DIAGNOSIS:    ICD-10-CM ICD-9-CM    1. Anterior epistaxis R04.0 784.7        IMPRESSION/PLAN:  Patient was noted today to have a varicosity associated with left nasal septum. Nose was decongested and anesthetized with Afrin and lidocaine, and set approximately 7-10 minutes. Patient underwent nasal cautery followed by fibrillar packing for reinforcement. Patient will follow-up in 1 week for progress. Epistaxis precautions were reviewed.    The plan was explained and patient is without any further questions.     Follow up in 1 week(s), or  sooner if new or worsening symptoms occur.    Electronically signed by Meyer Russel, PA on 09/13/2015 at 10:26 AM

## 2015-09-20 ENCOUNTER — Ambulatory Visit
Admit: 2015-09-20 | Discharge: 2015-09-20 | Payer: MEDICARE | Attending: Physician Assistant | Primary: Geriatric Medicine

## 2015-09-20 DIAGNOSIS — R040 Epistaxis: Secondary | ICD-10-CM

## 2015-09-20 NOTE — Progress Notes (Signed)
Otolaryngology      Patient Name:  Donald Jackson  DOB:  [birth year]     CHIEF C/O:    Chief Complaint   Patient presents with   ??? Epistaxis     f/u epistaxis--no bleeds since last wednesday       HISTORY OBTAINED FROM:  patient    HISTORY OF PRESENT ILLNESS:       Donald Jackson is a 70 y.o. male, here today for follow up of epistaxis of the left nostril. He is using ocean spray twice a day. Used polysporin last night and the night before. Using humidifier daily. No further bleeds. No dizziness, headaches.     Patient takes aspirin every other day.     Past Medical History   Diagnosis Date   ??? DM (diabetes mellitus) (HCC)      not on any medications   ??? Hyperlipidemia    ??? Hypertension    ??? Thyroid disease      Past Surgical History   Procedure Laterality Date   ??? Total thyroidectomy  02/20/2015       Current Outpatient Prescriptions:   ???  gabapentin (NEURONTIN) 100 MG capsule, 100 mg 2 times daily , Disp: , Rfl:   ???  vitamin D (ERGOCALCIFEROL) 50000 UNITS CAPS capsule, 1,000 Units , Disp: , Rfl:   ???  NIFEdipine (ADALAT CC) 30 MG CR tablet, , Disp: , Rfl:   ???  levothyroxine (LEVOTHROID) 100 MCG tablet, Take 1 tablet by mouth daily, Disp: 30 tablet, Rfl: 3  ???  metFORMIN (GLUCOPHAGE) 500 MG tablet, Take 1 tablet by mouth 2 times daily (with meals), Disp: 60 tablet, Rfl: 1  ???  lisinopril (PRINIVIL;ZESTRIL) 10 MG tablet, Take 1 tablet by mouth daily, Disp: 30 tablet, Rfl: 3  ???  rosuvastatin (CRESTOR) 10 MG tablet, TAKE 1 TABLET DAILY, Disp: 90 tablet, Rfl: 3  ???  aspirin 81 MG tablet, Take 81 mg by mouth daily Last dose 02/10/2015, Disp: , Rfl:   Review of patient's allergies indicates no known allergies.  Social History   Substance Use Topics   ??? Smoking status: Former Smoker     Quit date: 03/03/1988   ??? Smokeless tobacco: Never Used   ??? Alcohol use No     Family History   Problem Relation Age of Onset   ??? Diabetes Mother    ??? Cancer Father      Prostate Cancer       Review of Systems   Constitutional: Negative for chills and  fever.   HENT: Negative for congestion, ear pain and nosebleeds.    Eyes: Negative.    Respiratory: Negative.    Cardiovascular: Negative.    Gastrointestinal: Negative.    Neurological: Negative for dizziness and headaches.   Endo/Heme/Allergies: Negative for environmental allergies. Does not bruise/bleed easily.   All other systems reviewed and are negative.      BP 138/75 (Site: Left Arm, Position: Sitting, Cuff Size: Large Adult)  Pulse 60  Ht  (1.88 m)  Wt 248 lb (112.5 kg)  BMI 31.84 kg/m2  Physical Exam   Constitutional: He appears well-developed and well-nourished.   HENT:   Head: Normocephalic and atraumatic.   Right Ear: Tympanic membrane, external ear and ear canal normal. Tympanic membrane is not erythematous.   Left Ear: Tympanic membrane, external ear and ear canal normal. Tympanic membrane is not erythematous.   Nose: No mucosal edema or rhinorrhea.       Mouth/Throat: Oropharynx  is clear and moist and mucous membranes are normal.   Eyes: Conjunctivae and EOM are normal. Pupils are equal, round, and reactive to light.   Neck: Normal range of motion. Neck supple. No tracheal deviation present. No thyromegaly present.   Cardiovascular: Normal rate.    Pulmonary/Chest: Effort normal. No respiratory distress.   Musculoskeletal: Normal range of motion.   Neurological: He is alert. No cranial nerve deficit.   Skin: Skin is warm. No erythema.   Psychiatric: He has a normal mood and affect. His behavior is normal. Thought content normal.   Vitals reviewed.    DIAGNOSIS:    ICD-10-CM ICD-9-CM    1. Anterior epistaxis R04.0 784.7      IMPRESSION/PLAN:  Epistaxis precautions again reviewed. Advised to continue saline spray and neosporin use in left nostril and humidification. Follow up in may for thyroid recheck. Advised to call the office if any further epistaxis.     Advised to discuss with PCP regarding aspirin use.     The plan was explained and patient is without any further questions.     Follow up in  4 months, or sooner if new or worsening symptoms occur.    Electronically signed by Meyer Russel, PA on 09/20/2015 at 9:27 AM

## 2015-09-20 NOTE — Patient Instructions (Addendum)
Continue to use over the counter Ocean Nasal Spray 2-3 times a day or any other over the counter saline nasal spray in both nostrils for at least 3 more weeks   ??  Use a small dab of polysporin in the left nostril once a day for 2 more weeks.   Continue humidifier use. Make sure to clean regularly.   ??  If your nosebleed starts up pinch the soft part of your nose for 7 minutes, if it does not stop, after 7 minutes pinch again for another 7 more minutes. If nosebleed continues after the 14 minutes, you can call our office during regular business hours [phone] and after business hours go to the nearest Emergency Department.     If you have another nosebleed or any new or worsening symptoms please call the office to be seen again.    Call family physician to discuss if he would like you to go back to daily aspirin use.

## 2015-12-19 NOTE — Telephone Encounter (Signed)
I called patient to confirm appointment on 5/23 @830  am with Dr Bayard MalesBunevich and to remind patient that he needed to have Thyroid Studies drawn prior to the visit.  Left message for patient.

## 2016-01-02 NOTE — Telephone Encounter (Signed)
Lm for pt informing of apt on 5/23 and we need thyroid labs drawn a week prior to apt. Asked to call office to confirm labs will be done prior to apt.

## 2016-01-03 LAB — T3: T3, Total: 96.42 ng/dL (ref 80.00–200.00)

## 2016-01-03 LAB — TSH: TSH: 4.57 u[IU]/mL — ABNORMAL HIGH (ref 0.270–4.200)

## 2016-01-03 LAB — T4, FREE: T4 Free: 1.31 ng/dL (ref 0.93–1.70)

## 2016-01-08 NOTE — Telephone Encounter (Signed)
Left a meesage to call the office. Patient needs lab work done 1 week before his appointment next week on May 23 rd.

## 2016-01-15 ENCOUNTER — Ambulatory Visit
Admit: 2016-01-15 | Discharge: 2016-01-15 | Payer: MEDICARE | Attending: Facial Plastic Surgery | Primary: Geriatric Medicine

## 2016-01-15 DIAGNOSIS — E039 Hypothyroidism, unspecified: Secondary | ICD-10-CM

## 2016-01-15 MED ORDER — LEVOTHYROXINE SODIUM 100 MCG PO TABS
100 MCG | ORAL_TABLET | Freq: Every day | ORAL | 1 refills | Status: DC
Start: 2016-01-15 — End: 2016-01-24

## 2016-01-15 NOTE — Patient Instructions (Signed)
Needs labs done prior to return in 6 months

## 2016-01-20 NOTE — Progress Notes (Signed)
[facility]  Dr. Jilda Panda D. Bayard Males, D.O. Ms.Ed        Patient Name:  Donald Jackson  DOB:  [birth year]     CHIEF C/O:    Chief Complaint   Patient presents with   ??? Results     fu lab results on thyroid results in epic       HISTORY OBTAINED FROM:  patient    HISTORY OF PRESENT ILLNESS:       Donald Jackson is a 70 y.o. year old male, here today for follow up of history of thyroidectomy for repeat thyroid laboratory finding check.  Patient denies any associated recent weight gain, changes in skin or hair textures, no associated difficulty swallowing and are noted neck swelling.  Patient has no new complaints related to nasal congestion and tinnitus vertigo or hearing loss as well.      Past Medical History:   Diagnosis Date   ??? DM (diabetes mellitus) (HCC)     not on any medications   ??? Hyperlipidemia    ??? Hypertension    ??? Thyroid disease      Past Surgical History:   Procedure Laterality Date   ??? TOTAL THYROIDECTOMY  02/20/2015       Current Outpatient Prescriptions:   ???  levothyroxine (LEVOTHROID) 100 MCG tablet, Take 1 tablet by mouth daily, Disp: 90 tablet, Rfl: 1  ???  gabapentin (NEURONTIN) 100 MG capsule, 100 mg 2 times daily , Disp: , Rfl:   ???  vitamin D (ERGOCALCIFEROL) 50000 UNITS CAPS capsule, 1,000 Units , Disp: , Rfl:   ???  NIFEdipine (ADALAT CC) 30 MG CR tablet, , Disp: , Rfl:   ???  metFORMIN (GLUCOPHAGE) 500 MG tablet, Take 1 tablet by mouth 2 times daily (with meals), Disp: 60 tablet, Rfl: 1  ???  lisinopril (PRINIVIL;ZESTRIL) 10 MG tablet, Take 1 tablet by mouth daily, Disp: 30 tablet, Rfl: 3  ???  rosuvastatin (CRESTOR) 10 MG tablet, TAKE 1 TABLET DAILY, Disp: 90 tablet, Rfl: 3  ???  aspirin 81 MG tablet, Take 81 mg by mouth daily Last dose 02/10/2015, Disp: , Rfl:   Review of patient's allergies indicates no known allergies.  Social History   Substance Use Topics   ??? Smoking status: Former Smoker     Quit date: 03/03/1988   ??? Smokeless tobacco: Never Used   ??? Alcohol use No     Family History   Problem Relation  Age of Onset   ??? Diabetes Mother    ??? Cancer Father      Prostate Cancer       Review of Systems   Constitutional: Negative for chills and fever.   HENT: Negative for ear discharge and hearing loss.    Eyes: Negative for blurred vision and double vision.   Respiratory: Negative for cough and shortness of breath.    Cardiovascular: Negative for chest pain and palpitations.   Gastrointestinal: Negative for heartburn and vomiting.   Skin: Negative for itching and rash.   Neurological: Negative for dizziness, tingling and headaches.   Endo/Heme/Allergies: Negative for environmental allergies. Does not bruise/bleed easily.   All other systems reviewed and are negative.      BP (!) 146/84 (Site: Right Arm, Position: Sitting, Cuff Size: Medium Adult)   Pulse 59   Ht 6' 1.5" (1.867 m)   Wt 246 lb 6.4 oz (111.8 kg)   BMI 32.07 kg/m2  Physical Exam   Constitutional: He appears well-developed and well-nourished.  Eyes: EOM are normal. Pupils are equal, round, and reactive to light.   Neck: Normal range of motion. No tracheal deviation present. No thyromegaly present.   Cardiovascular: Normal rate and intact distal pulses.    Pulmonary/Chest: Effort normal. No respiratory distress.   Musculoskeletal: Normal range of motion. He exhibits no edema.   Lymphadenopathy:     He has no cervical adenopathy.   Neurological: He is alert. No cranial nerve deficit.   Skin: Skin is warm. No erythema.   Nursing note and vitals reviewed.      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  Patient has remained relatively stable, he is borderline high at this point I recommend a repeat check in 3 months of TSH and T4 or sooner if the patient becomes symptomatic.    Dr. Jilda PandaJared D. Valyn Latchford D.O. Ms. Renae FickleEd.  Otolaryngology Facial Plastic Surgery  Program Director:  [facility]  Associate Clinical Professor:  Edd FabianLECOM, OUCOM, NEOMED  [facility]

## 2016-01-24 MED ORDER — LEVOTHYROXINE SODIUM 100 MCG PO TABS
100 MCG | ORAL_TABLET | Freq: Every day | ORAL | 1 refills | Status: DC
Start: 2016-01-24 — End: 2016-07-02

## 2016-01-24 NOTE — Telephone Encounter (Signed)
Patient called asking for a [facility] on a [facility] [address] in [place], did not leave name of medication.

## 2016-01-24 NOTE — Telephone Encounter (Signed)
Called patient back to get name of medication; patient stated he needs refill on his levothyroxine ; order pended.

## 2016-03-07 ENCOUNTER — Encounter: Payer: MEDICARE | Attending: Facial Plastic Surgery | Primary: Geriatric Medicine

## 2016-06-24 NOTE — Telephone Encounter (Signed)
Called to remind patient that labs are needed prior to his appointment on the 8th . Voice mail is not set up.

## 2016-06-25 NOTE — Telephone Encounter (Signed)
Spoke to patient and he will come here tomorrow to have blood drawn

## 2016-06-26 ENCOUNTER — Inpatient Hospital Stay: Attending: Facial Plastic Surgery | Primary: Geriatric Medicine

## 2016-06-26 DIAGNOSIS — E039 Hypothyroidism, unspecified: Secondary | ICD-10-CM

## 2016-06-26 LAB — TSH: TSH: 4.2 u[IU]/mL (ref 0.270–4.200)

## 2016-06-26 LAB — T4: T4, Total: 9 ug/dL (ref 4.5–11.7)

## 2016-06-27 NOTE — Telephone Encounter (Signed)
Labs completed

## 2016-07-02 ENCOUNTER — Ambulatory Visit
Admit: 2016-07-02 | Discharge: 2016-07-02 | Payer: MEDICARE | Attending: Facial Plastic Surgery | Primary: Geriatric Medicine

## 2016-07-02 DIAGNOSIS — E039 Hypothyroidism, unspecified: Secondary | ICD-10-CM

## 2016-07-02 MED ORDER — LEVOTHYROXINE SODIUM 100 MCG PO TABS
100 MCG | ORAL_TABLET | Freq: Every day | ORAL | 1 refills | Status: DC
Start: 2016-07-02 — End: 2017-01-03

## 2016-07-07 NOTE — Progress Notes (Signed)
[facility]  Dr. Jilda PandaJared D. Soila Printup, D.O. Ms.Ed.  New Consult       Patient Name:  Donald DillCarl H Berwick  DOB:  09/21/1945     CHIEF C/O:    Chief Complaint   Patient presents with   ??? Follow-up     6 month follow up thyroid labs in epic       HISTORY OBTAINED FROM:  patient    HISTORY OF PRESENT ILLNESS:       Donald AshCarl is a 70 y.o. year old male, here today for Status post thyroidectomy for history of thyroid disease currently taking medical therapy and reviewing for patient's preoperative dose of Synthroid.  No current complaints of weight gain and fatigue difficulty swallowing shortness of breath or nausea or vomiting or neck masses.      Past Medical History:   Diagnosis Date   ??? DM (diabetes mellitus) (HCC)     not on any medications   ??? Hyperlipidemia    ??? Hypertension    ??? Thyroid disease      Past Surgical History:   Procedure Laterality Date   ??? TOTAL THYROIDECTOMY  02/20/2015       Current Outpatient Prescriptions:   ???  levothyroxine (LEVOTHROID) 100 MCG tablet, Take 1 tablet by mouth daily, Disp: 90 tablet, Rfl: 1  ???  gabapentin (NEURONTIN) 100 MG capsule, 100 mg 2 times daily , Disp: , Rfl:   ???  vitamin D (ERGOCALCIFEROL) 50000 UNITS CAPS capsule, 1,000 Units , Disp: , Rfl:   ???  NIFEdipine (ADALAT CC) 30 MG CR tablet, , Disp: , Rfl:   ???  metFORMIN (GLUCOPHAGE) 500 MG tablet, Take 1 tablet by mouth 2 times daily (with meals), Disp: 60 tablet, Rfl: 1  ???  lisinopril (PRINIVIL;ZESTRIL) 10 MG tablet, Take 1 tablet by mouth daily, Disp: 30 tablet, Rfl: 3  ???  rosuvastatin (CRESTOR) 10 MG tablet, TAKE 1 TABLET DAILY, Disp: 90 tablet, Rfl: 3  ???  aspirin 81 MG tablet, Take 81 mg by mouth daily Last dose 02/10/2015, Disp: , Rfl:   Review of patient's allergies indicates no known allergies.  Social History   Substance Use Topics   ??? Smoking status: Former Smoker     Quit date: 03/03/1988   ??? Smokeless tobacco: Never Used   ??? Alcohol use No     Family History   Problem Relation Age of Onset   ??? Diabetes Mother    ??? Cancer  Father      Prostate Cancer       Review of Systems   Constitutional: Negative for chills and fever.   HENT: Negative for ear discharge and hearing loss.    Eyes: Negative for blurred vision and double vision.   Respiratory: Negative for cough and shortness of breath.    Cardiovascular: Negative for chest pain and palpitations.   Gastrointestinal: Negative for heartburn and vomiting.   Skin: Negative for itching and rash.   Neurological: Negative for dizziness, tingling and headaches.   Endo/Heme/Allergies: Negative for environmental allergies. Does not bruise/bleed easily.   All other systems reviewed and are negative.      BP 137/76 (Site: Left Arm, Position: Sitting, Cuff Size: Large Adult)    Pulse 53    Ht 6' 1.5" (1.867 m)    Wt 248 lb 6.4 oz (112.7 kg)    BMI 32.33 kg/m??   Physical Exam   Constitutional: He is oriented to person, place, and time. He appears well-developed and well-nourished.  HENT:   Head: Normocephalic and atraumatic.   Right Ear: Hearing, tympanic membrane, external ear and ear canal normal.   Left Ear: Hearing, tympanic membrane, external ear and ear canal normal.   Nose: Mucosal edema, rhinorrhea and nose lacerations present.   Mouth/Throat: Uvula is midline and mucous membranes are normal. Normal dentition. No dental abscesses or dental caries. No posterior oropharyngeal edema, posterior oropharyngeal erythema or tonsillar abscesses.   Eyes: Conjunctivae and EOM are normal. Pupils are equal, round, and reactive to light.   Neck: Normal range of motion. Neck supple.   Cardiovascular: Normal rate, regular rhythm, normal heart sounds and intact distal pulses.    Pulmonary/Chest: Effort normal and breath sounds normal.   Abdominal: Soft. Bowel sounds are normal.   Musculoskeletal: Normal range of motion.   Neurological: He is alert and oriented to person, place, and time.   Skin: Skin is warm and dry.   Psychiatric: He has a normal mood and affect. His behavior is normal. Judgment and  thought content normal.   Nursing note and vitals reviewed.      IMPRESSION/PLAN:  Patient is currently euthyroid, incision is clean dry and intact neck is soft without masses tumors or lesions.  Patient myself continued surveillance.    Dr. Jilda PandaJared D. Nyair Depaulo D.O. Ms. Renae FickleEd.  Otolaryngology Facial Plastic Surgery  Program Director:  [facility]  Associate Clinical Professor:  Edd FabianLECOM, OUCOM, NEOMED  [facility]

## 2016-09-04 ENCOUNTER — Institutional Professional Consult (permissible substitution)
Admit: 2016-09-04 | Discharge: 2016-09-04 | Payer: MEDICARE | Attending: Neurological Surgery | Primary: Geriatric Medicine

## 2016-09-04 DIAGNOSIS — M48061 Spinal stenosis, lumbar region without neurogenic claudication: Secondary | ICD-10-CM

## 2016-09-04 LAB — POCT RAPID DRUG SCREEN URINE
Amphetamine Screen, Urine: NEGATIVE
Barbiturate Screen, Urine: NEGATIVE
Benzodiazepine Screen, Urine: NEGATIVE
Cocaine Metabolite Screen, Urine: NEGATIVE
MDMA, Urine: NEGATIVE
Methadone Screen, Urine: NEGATIVE
Methamphetamine, Urine: NEGATIVE
Opiate Scrn, Ur: NEGATIVE
Oxycodone Screen, Ur: NEGATIVE
PCP Screen, Urine: NEGATIVE
Propoxyphene Screen, Urine: NEGATIVE
THC: NEGATIVE
Tricyclic Antidepressants, Urine: NEGATIVE

## 2016-09-04 LAB — URINE DRUG SCREEN
6-Acetylmorphine, Ur: NEGATIVE
Amphetamine Screen, Urine: NEGATIVE
Barbiturate Screen, Urine: NEGATIVE
Benzodiazepine Screen, Urine: NEGATIVE
Cannabinoid Scrn, Ur: NEGATIVE
Cocaine Metabolite, Urine: NEGATIVE
Creatinine, Ur: 276.2 mg/dL
EDDP, Urine: NEGATIVE
Ethanol, Ur: NEGATIVE
MDMA, Urine: NEGATIVE
Methadone Screen, Urine: NEGATIVE
Methamphetamine, Urine: NEGATIVE
Opiates, Urine: NEGATIVE
Oxycodone: NEGATIVE
PCP: NEGATIVE
Phencyclidine, Urine: NEGATIVE
Propoxyphene, Urine: NEGATIVE
Tricyclic Antidepressants, Urine: NEGATIVE
pH, Urine: 5.7

## 2016-09-04 NOTE — Progress Notes (Signed)
Age:  71 y.o.   Date: 09/04/2016  Chief Complaint   Patient presents with   ??? New Patient     lumbar stenosis        History of Present Illness:  Donald Jackson is a 71 y.o. right-handed male who is being seen today in consultation at the request of Dr. Andrey CampanileJames Enyeart for lumbar stenosis.  He complains of low back pain that does not radiate.  The patient's symptoms began about a year ago.  There is no known injury.   He describes the pain as stabbing.  It is an occasional pain which he rates an 8/10 on the pain scale. The pain does not disturb his sleep.  Tylenol and resting makes his pain better, whereas overdoing daily activity makes the pain worse.  He does have numbness in his left leg and burning in his feet and toes.  He has not done physical therapy in the last year, does not exercise at home, and does not use a TENS unit.  The patient has taken anti-inflammatories in the past 3 months. He is not working due to retirement.  His last testing was an MRI lumbar on 07/03/2016.    Current Medications:    Current Outpatient Prescriptions   Medication Sig Dispense Refill   ??? levothyroxine (LEVOTHROID) 100 MCG tablet Take 1 tablet by mouth daily 90 tablet 1   ??? gabapentin (NEURONTIN) 100 MG capsule 100 mg 2 times daily      ??? vitamin D (ERGOCALCIFEROL) 50000 UNITS CAPS capsule 1,000 Units      ??? NIFEdipine (ADALAT CC) 30 MG CR tablet      ??? metFORMIN (GLUCOPHAGE) 500 MG tablet Take 1 tablet by mouth 2 times daily (with meals) 60 tablet 1   ??? lisinopril (PRINIVIL;ZESTRIL) 10 MG tablet Take 1 tablet by mouth daily 30 tablet 3   ??? rosuvastatin (CRESTOR) 10 MG tablet TAKE 1 TABLET DAILY 90 tablet 3   ??? aspirin 81 MG tablet Take 81 mg by mouth daily Last dose 02/10/2015       No current facility-administered medications for this visit.        Medication Allergies:  No Known Allergies     Past Medical History:   Diagnosis Date   ??? DM (diabetes mellitus) (HCC)     not on any medications   ??? Hyperlipidemia    ??? Hypertension    ??? Lumbar  stenosis    ??? Numbness     LEFT LEG   ??? Thyroid disease         Past Surgical History:   Procedure Laterality Date   ??? TOTAL THYROIDECTOMY  02/20/2015        Social History/Habits:    Social History     Social History   ??? Marital status: Married     Spouse name: N/A   ??? Number of children: N/A   ??? Years of education: N/A     Occupational History   ??? Not on file.     Social History Main Topics   ??? Smoking status: Former Smoker     Quit date: 03/03/1988   ??? Smokeless tobacco: Former NeurosurgeonUser   ??? Alcohol use 0.0 oz/week      Comment: RARELY   ??? Drug use: No      Comment: FORMER USER   ??? Sexual activity: Not on file     Other Topics Concern   ??? Not on file     Social History Narrative   ???  No narrative on file        Family History   Problem Relation Age of Onset   ??? Diabetes Mother    ??? Cancer Father      Prostate Cancer        Complete Review of Systems:    Constitutional:  No recent fever, chills, fatigue, or weight change.   Eyes:  No history of blurred vision or double vision.   ENT:  No hearing loss or chronic sinus problems. He has history of nose bleeds.  Cardiovascular: No history of chest pain, leg swelling, palpitations, SOB or problems with lying flat/walking.    Gastrointestinal:  No complaints of nausea, vomiting, loss of appetite, or change in bowel function.    Genitourinary:  He has change in bladder function.   Musculoskeletal:  He has joint pain, joint stiffness, muscle weakness, back pain, neck pain, arm pain.   Integumentary:  No rash, itching, change in skin color or varicose veins.   Neurological: No history of frequent headaches, lightheadedness, convulsions, or head injury.    Psychiatric:  No history of memory loss, nervousness, depression, or insomnia.    PHYSICAL EXAMINATION:  Vitals:    09/04/16 1032   BP: (!) 148/86   Pulse: 59   Temp: 96.6 ??F (35.9 ??C)   Weight: 246 lb (111.6 kg)   Height: 6\' 2"  (1.88 m)      Skin: Warm and dry with normal color, no cyanosis or edema.   Lymphadenopathy or  masses: None in either axilla.   HEENT: Normal cephalic, atraumatic.   Neck: Supple. No masses. No bruits.   Carotids: Strong and bounding  Lungs: Clear.   Heart: Regular rate and rhythm. No murmurs.   Abdomen: Benign. No masses. No organomegaly.   Pulses: Good pulse of all four extremities and normal capillary refill.     NEUROLOGICAL EXAMINATION:     MENTAL STATUS: The patient is awake, alert, and oriented x 3 with normal mentation, cerebration and speech. Naming is intact. The patient can follow commands without difficulty. Recent and remote memory are intact. Attention span is within normal limits. No language difficulties. Normal fund of knowledge.     CRANIAL NERVES: Visual fields are full to confrontation. Visual acuity is intact to finger counting. Pupils are equal, round, and reactive to light and accommodation. No evidence of elevated intracranial pressure. There is conjugate eye movement without nystagmus. Facial sensation is intact and symmetric. Masseter muscles are 5/5 bilaterally. Facial expressions are symmetric and intact. The patient is able to hear rubbing fingers bilaterally. The tongue is midline on protrusion as is the uvula with symmetrical elevation of the soft palate with phonation. Shoulder shrug is 5/5 bilaterally.     MOTOR EXAM:   Right Deltoid 5/5 Left Deltoid 5/5   Right Biceps 5/5 Left Biceps 5/5   Right Triceps 5/5 Left Triceps 5/5   Right Hand Motor 5/5 Left Hand Motor 5/5         Right Hip Flexion 5/5 Left Hip Flexion 5/5   Right Hip Extension 5/5 Left Hip Extension 5/5   Right Knee Flexion 5/5 Left Knee Flexion 5/5   Right Knee Extension 5/5 Left Knee Extension 5/5   Right Dorsi Flexion 5/5 Left Dorsi Flexion 5/5   Right Plantar Flexion 5/5 Left Plantar Flexion 5/5     SENSORY EXAM: Sensory exam is intact to light touch, pin prick and position except that he has some hypalgesia/hypesthesia left L5 distribution.  REFLEXES:  Right Biceps +2 Left Biceps +2   Right Triceps +2 Left  Triceps +2   Right Patellar +2 Left Patellar +2   Right Achilles +2 Left Achilles +2     MUSCULOSKELETAL: Patrick's test is normal bilaterally. Extremities are without pitting edema. Homans sign is negative.  There is no meningismus.     CEREBELLAR EXAM: Finger to nose testing is normal. Rapid alternating movements are normal. Romberg testing is negative. Steady gait.      Medical Decision Making:      Diagnosis:  Lumbar spinal stenosis    Management Options:  Conservative treatment options were discussed. Various medications by mouth, including NSAIDS, narcotics, and muscle relaxants, as well as the use of medications for more chronic pain, such as antidepressants and anticonvulsants, were covered including benefits, side effects and proper usage. Various types of physical therapy, including exercise, ultrasound, deep heat, and chiropractic manipulations, were also discussed. Epidural blocks and trigger point injections were discussed and all questions concerning these were answered. I also went over behavior modifications, such as activity restrictions, and explained the importance of proper posturing and proper lifting techniques. Surgical options were discussed, but should always be the last resort after all conservative measures have failed. I counseled him extensively and answered all of his questions.     Data Reviewed:  I did review the MRI done at [facility] and it shows spinal stenosis.      Risks and Complications:  The potential risks include the development of permanent neurologic deficits, chronic pain syndromes, as well as the potential for narcotic addiction.     Recommendations:  He is not interested in any treatments at this point.  Since he is asymptomatic, except for a bit of numbness, for now we will just treat him very conservatively.  I don't want him to strain and I don't want him to lift anything heavy.  If it gets worse, or he develops any foot drop, he knows where to find me.  I'll  see him back in 6 months.

## 2016-09-04 NOTE — Patient Instructions (Signed)
Patient Education        When You Are Overweight: Care Instructions  Your Care Instructions    If you're overweight, your doctor may recommend that you make changes in your eating and exercise habits. Being overweight can lead to serious health problems, such as high blood pressure, heart disease, type 2 diabetes, and arthritis, or it can make these problems worse. Eating a healthy diet and being more active can help you reach and stay at a healthy weight.  You don't have to make huge changes all at once. Start by making small changes in your eating and exercise habits. To lose weight, you need to burn more calories than you take in. You can do this by eating healthy foods in reasonable amounts and becoming more active every day.  Follow-up care is a key part of your treatment and safety. Be sure to make and go to all appointments, and call your doctor if you are having problems. It's also a good idea to know your test results and keep a list of the medicines you take.  How can you care for yourself at home?  ?? Improve your eating habits. You'll be more successful if you work on changing one eating habit at a time. All foods, if eaten in moderation, can be part of healthy eating. Remember to:  ?? Eat a variety of foods from each food group. Include grains, vegetables, fruits, dairy, and protein foods.  ?? Limit foods high in fat, sugar, and calories.  ?? Eat slowly. And don't do anything else, such as watch TV, while you are eating.  ?? Pay attention to portion sizes. Put your food on a smaller plate.  ?? Plan your meals ahead of time. You'll be less likely to grab something that's not as healthy.  ?? Get active. Regular activity can help you feel better, have more energy, and burn more calories. If you haven't been active, start slowly. Start with at least 30 minutes of moderate activity on most days of the week. Then gradually increase the amount of activity. Try for 60 or 90 minutes a day, at least 5 days a week.  There are a lot of ways to fit activity into your life. You can:  ?? Walk or bike to the store. Or walk with a friend, or walk the dog.  ?? Mow the lawn, rake leaves, shovel snow, or do some gardening.  ?? Use the stairs instead of the elevator, at least for a few floors.  ?? Change your thinking. Your thoughts have a lot to do with how you feel and what you do. When you're trying to reach a healthy weight, changing how you think about certain things may help. Here are some ideas:  ?? Don't compare yourself to others. Healthy bodies come in all shapes and sizes.  ?? Pay attention to how hungry or full you feel. When you eat, be aware of why you're eating and how much you're eating.  ?? Focus on improving your health instead of dieting. Dieting almost never works over the long term.  ?? Ask your doctor about other health professionals who can help you reach a healthy weight.  ?? A dietitian can help you make healthy changes in your diet.  ?? An exercise specialist or personal trainer can help you develop a safe and effective exercise program.  ?? A counselor or psychiatrist can help you cope with issues such as depression, anxiety, or family problems that can make it hard   to focus on reaching a healthy weight.  ?? Get support from your family, your doctor, your friends, a support group-and support yourself.  Where can you learn more?  Go to [email or web address] and sign in to your MyChart account. Enter L869 in the Search Health Information box to learn more about "When You Are Overweight: Care Instructions."     If you do not have an account, please click on the "Sign Up Now" link.  Current as of: June 07, 2015  Content Version: 11.5  ?? 2006-2017 [employer]. Care instructions adapted under license by [facility]. If you have questions about a medical condition or this instruction, always ask your healthcare professional. [employer] disclaims any warranty or liability for your  use of this information.

## 2016-09-08 DIAGNOSIS — M48061 Spinal stenosis, lumbar region without neurogenic claudication: Secondary | ICD-10-CM | POA: Insufficient documentation

## 2017-01-03 ENCOUNTER — Telehealth

## 2017-01-05 MED ORDER — LEVOTHYROXINE SODIUM 100 MCG PO TABS
100 MCG | ORAL_TABLET | ORAL | 1 refills | Status: DC
Start: 2017-01-05 — End: 2017-07-04

## 2017-01-05 NOTE — Telephone Encounter (Signed)
LMOM for patient to call and schedule and appointment which will be needed prior to getting any additional refills

## 2017-01-05 NOTE — Telephone Encounter (Signed)
Will need f/u appt

## 2017-01-26 ENCOUNTER — Ambulatory Visit
Admit: 2017-01-26 | Discharge: 2017-01-26 | Payer: MEDICARE | Attending: Physician Assistant | Primary: Geriatric Medicine

## 2017-01-26 ENCOUNTER — Inpatient Hospital Stay: Payer: MEDICARE | Primary: Geriatric Medicine

## 2017-01-26 DIAGNOSIS — E039 Hypothyroidism, unspecified: Secondary | ICD-10-CM

## 2017-01-26 LAB — T4, FREE: T4 Free: 1.4 ng/dL (ref 0.93–1.70)

## 2017-01-26 LAB — TSH: TSH: 6.42 u[IU]/mL — ABNORMAL HIGH (ref 0.270–4.200)

## 2017-01-26 LAB — T3: T3, Total: 97.65 ng/dL (ref 80.00–200.00)

## 2017-01-26 NOTE — Progress Notes (Signed)
[facility]    Patient Name:  Donald DillCarl H Marando  DOB:  [birth year]     CHIEF C/O:    Chief Complaint   Patient presents with   . Follow-up     6 month follow up thyroid patient has no concerns        HISTORY OBTAINED FROM:  patient    HISTORY OF PRESENT ILLNESS:       Donald AshCarl is a 71 y.o. year old male, here today for thyroid recheck. Patient states he is doing well denies any difficulty swallowing, shortness of breath, weight changes, excessive fatigue, or neck masses. He denies any swelling, or drainage from incision. He is continuing to take his Synthroid daily. Patient is noted to have a history of thyroidectomy for history of thyroid disease. He also denies any further nose bleeds. Thyroid labs will last completed in November, none completed recently.    Past Medical History:   Diagnosis Date   . DM (diabetes mellitus) (HCC)     not on any medications   . Hyperlipidemia    . Hypertension    . Lumbar stenosis    . Numbness     LEFT LEG   . Thyroid disease      Past Surgical History:   Procedure Laterality Date   . TOTAL THYROIDECTOMY  02/20/2015       Current Outpatient Prescriptions:   .  levothyroxine (SYNTHROID) 100 MCG tablet, TAKE 1 TABLET DAILY, Disp: 90 tablet, Rfl: 1  .  gabapentin (NEURONTIN) 100 MG capsule, 100 mg 2 times daily , Disp: , Rfl:   .  vitamin D (ERGOCALCIFEROL) 50000 UNITS CAPS capsule, 1,000 Units , Disp: , Rfl:   .  NIFEdipine (ADALAT CC) 30 MG CR tablet, , Disp: , Rfl:   .  metFORMIN (GLUCOPHAGE) 500 MG tablet, Take 1 tablet by mouth 2 times daily (with meals), Disp: 60 tablet, Rfl: 1  .  lisinopril (PRINIVIL;ZESTRIL) 10 MG tablet, Take 1 tablet by mouth daily, Disp: 30 tablet, Rfl: 3  .  rosuvastatin (CRESTOR) 10 MG tablet, TAKE 1 TABLET DAILY, Disp: 90 tablet, Rfl: 3  .  aspirin 81 MG tablet, Take 81 mg by mouth daily Last dose 02/10/2015, Disp: , Rfl:   Patient has no known allergies.  Social History   Substance Use Topics   . Smoking status: Former Smoker     Quit date: 03/03/1988    . Smokeless tobacco: Former NeurosurgeonUser   . Alcohol use 0.0 oz/week      Comment: RARELY     Family History   Problem Relation Age of Onset   . Diabetes Mother    . Cancer Father      Prostate Cancer       Review of Systems   Constitutional: Negative for chills and fever.   HENT: Negative for ear discharge, hearing loss and sore throat.    Eyes: Negative for blurred vision and double vision.   Respiratory: Negative for cough and shortness of breath.    Cardiovascular: Negative for chest pain and palpitations.   Gastrointestinal: Negative for heartburn and vomiting.   Neurological: Negative for dizziness and headaches.   Endo/Heme/Allergies: Negative for environmental allergies. Does not bruise/bleed easily.   All other systems reviewed and are negative.      BP 137/75 (Site: Left Arm, Position: Sitting, Cuff Size: Large Adult)   Pulse 62   Ht 6\' 2"  (1.88 m)   Wt 242 lb 12.8 oz (110.1 kg)  BMI 31.17 kg/m   Physical Exam   Constitutional: He appears well-developed and well-nourished.   HENT:   Head: Normocephalic and atraumatic.   Right Ear: Hearing, tympanic membrane, external ear and ear canal normal. Tympanic membrane is not erythematous.   Left Ear: Hearing, tympanic membrane, external ear and ear canal normal. Tympanic membrane is not erythematous.   Nose: Mucosal edema present. No rhinorrhea or nose lacerations.   Mouth/Throat: Uvula is midline and mucous membranes are normal. No posterior oropharyngeal edema or posterior oropharyngeal erythema.   Eyes: Conjunctivae and EOM are normal. Pupils are equal, round, and reactive to light.   Neck: Normal range of motion. Neck supple. No thyroid mass present.   Incision well healed.    Cardiovascular: Normal rate.    Pulmonary/Chest: Effort normal. No respiratory distress.   Musculoskeletal: Normal range of motion.   Neurological: He is alert. No cranial nerve deficit.   Skin: Skin is warm. No erythema.   Psychiatric: He has a normal mood and affect. His behavior is  normal. Judgment and thought content normal.   Nursing note and vitals reviewed.     Diagnosis Orders   1. Acquired hypothyroidism  TSH    T3    T4, FREE   2. S/P thyroidectomy  TSH    T3    T4, FREE     IMPRESSION/PLAN:  Patient advised to continue Synthroid daily. Thyroid labs ordered patient advised to complete them today. No neck masses noted on exam,   patient advised If any new or worsening symptoms call the office to be seen sooner, or report to the emergency department, if needed.     This note was done using voice recognition software. There may be accidental errors in grammar or spelling.   Meyer Russel, PA

## 2017-01-28 ENCOUNTER — Telehealth

## 2017-01-28 NOTE — Telephone Encounter (Signed)
Due to thyroid labs have patient repeat labs in 3 months and follow up after with Dr Bayard MalesBunevich for review. Order placed, please call patient to let him know.     Thanks

## 2017-01-29 NOTE — Telephone Encounter (Signed)
Patient was scheduled for an appointment; was advised we will call a few weeks before appointment to remind him to have labs re-drawn

## 2017-01-30 NOTE — Telephone Encounter (Signed)
Patient called back returning message, please call him back at [phone].

## 2017-01-30 NOTE — Telephone Encounter (Signed)
-----   Message from Meyer Russelryscilla R Maynard, [place]PA sent at 01/28/2017  5:00 PM EDT -----  Reviewed with Dr Bayard MalesBunevich due to thyroid labs have patient repeat labs in 3 months and follow up after with Dr Bayard MalesBunevich for review. Order placed, please call patient to let him know. Thanks!

## 2017-01-30 NOTE — Telephone Encounter (Signed)
LMOVM for patient to call office

## 2017-01-30 NOTE — Telephone Encounter (Signed)
Spoke with patient and he is aware

## 2017-02-16 NOTE — Telephone Encounter (Signed)
Patient called stating that someone called him to confirm appt. He doesn't remember making an appt. I told him it was made in January when he saw Dr. York SpanielSaid he can't come as he is going out of town. He will call back to R/S with his PCP when he gets back in town. He doesn't feel the need to come back since he doesn't want surgery.  PAB

## 2017-02-17 ENCOUNTER — Encounter: Payer: MEDICARE | Attending: Neurological Surgery | Primary: Geriatric Medicine

## 2017-04-20 NOTE — Telephone Encounter (Signed)
Called patient and remind him that he needs to get his lab work done before appointment on the 14th. Spoke to patient and he will get it done.

## 2017-04-20 NOTE — Telephone Encounter (Signed)
-----   Message from Lamar Benes sent at 01/29/2017  9:27 AM EDT -----  Please call patient and remind him to get thyroid labs before 05/01/17 appointment

## 2017-04-29 NOTE — Telephone Encounter (Signed)
Spoke with patient and he states that he has appt with PCP 05/04/17 and will have labs drawn and sent to us before 05/08/17 appt

## 2017-05-01 ENCOUNTER — Encounter: Attending: Facial Plastic Surgery | Primary: Geriatric Medicine

## 2017-05-04 LAB — T4: T4, Total: 0.98

## 2017-05-04 LAB — TSH: TSH: 6.72 u[IU]/mL

## 2017-05-08 ENCOUNTER — Ambulatory Visit
Admit: 2017-05-08 | Discharge: 2017-05-08 | Payer: MEDICARE | Attending: Facial Plastic Surgery | Primary: Geriatric Medicine

## 2017-05-08 DIAGNOSIS — E039 Hypothyroidism, unspecified: Secondary | ICD-10-CM

## 2017-05-08 MED ORDER — SYNTHROID 75 MCG PO TABS
75 MCG | ORAL_TABLET | Freq: Every day | ORAL | 3 refills | Status: AC
Start: 2017-05-08 — End: ?

## 2017-05-26 NOTE — Progress Notes (Signed)
[facility]  Dr. Jilda Panda D. Bayard Males, D.O. Ms.Ed        Patient Name:  Donald Jackson  DOB:  10/05/1945     CHIEF C/O:    Chief Complaint   Patient presents with   . Follow-up     3 month        HISTORY OBTAINED FROM:  patient    HISTORY OF PRESENT ILLNESS:       Donald Jackson is a 71 y.o. year old male, here today for follow up of History of thyroidectomy, doing well without any complaints of hair loss, weight gain, no associated skin changes, some mild daytime fatigue without any associated significant changes and activities of daily living.  Repeat lab work was reviewed, no other new complaints at this time.      Past Medical History:   Diagnosis Date   . DM (diabetes mellitus) (HCC)     not on any medications   . Hyperlipidemia    . Hypertension    . Lumbar stenosis    . Numbness     LEFT LEG   . Thyroid disease      Past Surgical History:   Procedure Laterality Date   . TOTAL THYROIDECTOMY  02/20/2015       Current Outpatient Prescriptions:   .  SYNTHROID 75 MCG tablet, Take 1.5 tablets by mouth Daily, Disp: 30 tablet, Rfl: 3  .  levothyroxine (SYNTHROID) 100 MCG tablet, TAKE 1 TABLET DAILY, Disp: 90 tablet, Rfl: 1  .  gabapentin (NEURONTIN) 100 MG capsule, 100 mg 2 times daily , Disp: , Rfl:   .  vitamin D (ERGOCALCIFEROL) 50000 UNITS CAPS capsule, 1,000 Units , Disp: , Rfl:   .  NIFEdipine (ADALAT CC) 30 MG CR tablet, , Disp: , Rfl:   .  metFORMIN (GLUCOPHAGE) 500 MG tablet, Take 1 tablet by mouth 2 times daily (with meals), Disp: 60 tablet, Rfl: 1  .  lisinopril (PRINIVIL;ZESTRIL) 10 MG tablet, Take 1 tablet by mouth daily, Disp: 30 tablet, Rfl: 3  .  rosuvastatin (CRESTOR) 10 MG tablet, TAKE 1 TABLET DAILY, Disp: 90 tablet, Rfl: 3  .  aspirin 81 MG tablet, Take 81 mg by mouth daily Last dose 02/10/2015, Disp: , Rfl:   Patient has no known allergies.  Social History   Substance Use Topics   . Smoking status: Former Smoker     Quit date: 03/03/1988   . Smokeless tobacco: Former Neurosurgeon   . Alcohol use 0.0 oz/week       Comment: RARELY     Family History   Problem Relation Age of Onset   . Diabetes Mother    . Cancer Father         Prostate Cancer       Review of Systems   Constitutional: Negative for chills and fever.   HENT: Negative for congestion, ear discharge, hearing loss and sore throat.    Eyes: Negative for blurred vision and double vision.   Respiratory: Negative for cough, shortness of breath and stridor.    Cardiovascular: Negative for chest pain and palpitations.   Gastrointestinal: Negative for heartburn and vomiting.   Skin: Negative for itching and rash.   Neurological: Negative for dizziness, tingling and headaches.   Endo/Heme/Allergies: Negative for environmental allergies. Does not bruise/bleed easily.   All other systems reviewed and are negative.      BP (!) 140/74 (Site: Left Upper Arm, Position: Sitting, Cuff Size: Large Adult)   Pulse 59  Wt 241 lb (109.3 kg)   BMI 30.94 kg/m   Physical Exam   Constitutional: He is oriented to person, place, and time. He appears well-developed and well-nourished.   HENT:   Head: Normocephalic and atraumatic.       Right Ear: Hearing, tympanic membrane and ear canal normal.   Left Ear: Hearing, tympanic membrane and ear canal normal.   Nose: Mucosal edema and rhinorrhea present. No nose lacerations.   Mouth/Throat: Uvula is midline. Mucous membranes are not dry and not cyanotic. No dental abscesses.   Eyes: Pupils are equal, round, and reactive to light. Conjunctivae and EOM are normal.   Neck: Normal range of motion. Neck supple.   Cardiovascular: Normal rate and intact distal pulses.    Pulmonary/Chest: Effort normal and breath sounds normal. No respiratory distress.   Abdominal: He exhibits no distension. There is no tenderness. There is no guarding.   Musculoskeletal: Normal range of motion.   Neurological: He is alert and oriented to person, place, and time.   Skin: Skin is warm and dry.   Psychiatric: He has a normal mood and affect. His behavior is normal.  Judgment and thought content normal.   Nursing note and vitals reviewed.      IMPRESSION/PLAN:  Patient is seen and examined status post total thyroidectomy currently on 100 g of Synthroid with previous history of elevated TSH, I will repeat laboratory findings to be conducted today as well as follow-up in 4-6 months may need to increase the current dose however patient has been somewhat noncompliant with lab work since June.    Dr. Jilda PandaJared D. Manaia Samad D.O. Ms. Renae FickleEd.  Otolaryngology Facial Plastic Surgery  Program Director:  [facility]  Associate Clinical Professor:  Edd FabianLECOM, [employer], [employer]  [facility]

## 2017-05-29 ENCOUNTER — Encounter

## 2017-06-09 NOTE — Telephone Encounter (Signed)
Spoke with patient is to have labs drawn before next appt.      Patient asked about his medication, (synthroid)  States that he was to get a new script and that the MG where to be increase.  Patient would like a call back on what is going on with this medication

## 2017-06-12 NOTE — Telephone Encounter (Signed)
Current Synthroid dose is appropriate according to laboratory work.

## 2017-06-17 NOTE — Telephone Encounter (Signed)
Left a message for patient to call us back about thyroid medication.

## 2017-06-24 NOTE — Telephone Encounter (Signed)
Left another message for pt to call the office

## 2017-06-24 NOTE — Telephone Encounter (Signed)
Pt returned call and stated that the medication is not at the pharmacy . Looked again at the order from September and it was a no print, informed patient that I would make sure the medciation is at the pharmacy today and to get started . [facility] and will [facility] it up then and get started. Informed that after starting new dose that lab work needs done 6 weeks afterwards. Asked patient to let me know when he starts it so that we can make sure labs are done and will watch for Lab results from Dr Ezra SitesEnyeart's office . Patient stated that I can have it done there at your office right? Yes you can . I will watch for results in our system then         Called [facility] [address] and spoke to Clydie BraunKaren and gave new rx synthroid 75mcg 1.5 tabs daily #45 with 3 refills

## 2017-07-04 ENCOUNTER — Telehealth

## 2017-07-06 MED ORDER — LEVOTHYROXINE SODIUM 100 MCG PO TABS
100 MCG | ORAL_TABLET | ORAL | 1 refills | Status: AC
Start: 2017-07-06 — End: ?

## 2017-07-06 NOTE — Telephone Encounter (Signed)
LOV: 05/08/17 NOV: 11/06/16

## 2017-07-22 NOTE — Telephone Encounter (Signed)
Per Last OV note- patient was to have labs then f/u in 4-6 months; please review labs and advise

## 2017-07-22 NOTE — Telephone Encounter (Signed)
Patient called to make an appointment for a follow up on his thyroid after getting his labs drawn. He was requesting that he be seen before 08/09/17, but it looks like he's scheduled for that on 11/06/17. Does the patient need to be seen sooner than that? Please call patient at [phone], thank you.

## 2017-07-23 NOTE — Telephone Encounter (Signed)
Patient's labs are within normal limits he is not to be seen until his March appointment

## 2017-07-23 NOTE — Telephone Encounter (Signed)
lmom for pt to call office back

## 2017-07-23 NOTE — Telephone Encounter (Signed)
Patient notifed

## 2017-07-30 ENCOUNTER — Encounter: Attending: Physician Assistant | Primary: Geriatric Medicine

## 2017-08-26 DIAGNOSIS — I10 Essential (primary) hypertension: Secondary | ICD-10-CM | POA: Insufficient documentation

## 2017-08-26 DIAGNOSIS — E559 Vitamin D deficiency, unspecified: Secondary | ICD-10-CM | POA: Insufficient documentation

## 2017-08-26 DIAGNOSIS — E7800 Pure hypercholesterolemia, unspecified: Secondary | ICD-10-CM | POA: Insufficient documentation

## 2017-08-26 DIAGNOSIS — E1140 Type 2 diabetes mellitus with diabetic neuropathy, unspecified: Secondary | ICD-10-CM | POA: Insufficient documentation

## 2017-08-26 DIAGNOSIS — E039 Hypothyroidism, unspecified: Secondary | ICD-10-CM | POA: Insufficient documentation

## 2017-08-28 DIAGNOSIS — Z862 Personal history of diseases of the blood and blood-forming organs and certain disorders involving the immune mechanism: Secondary | ICD-10-CM | POA: Insufficient documentation

## 2017-11-02 NOTE — Telephone Encounter (Signed)
Patient called and cancelled his appointment on 11/06/17 with Dr. Bayard Males because he's out of town. He stated that he will call back at another time to reschedule the appointment.

## 2017-11-06 ENCOUNTER — Encounter: Attending: Facial Plastic Surgery | Primary: Geriatric Medicine

## 2017-12-31 DIAGNOSIS — Z7185 Encounter for immunization safety counseling: Secondary | ICD-10-CM | POA: Insufficient documentation

## 2017-12-31 DIAGNOSIS — Z0000 Encounter for general adult medical examination without abnormal findings: Secondary | ICD-10-CM | POA: Insufficient documentation

## 2018-07-07 DIAGNOSIS — N528 Other male erectile dysfunction: Secondary | ICD-10-CM | POA: Insufficient documentation

## 2019-07-07 DIAGNOSIS — E538 Deficiency of other specified B group vitamins: Secondary | ICD-10-CM | POA: Insufficient documentation

## 2020-05-10 ENCOUNTER — Other Ambulatory Visit (INDEPENDENT_AMBULATORY_CARE_PROVIDER_SITE_OTHER): Payer: Self-pay | Admitting: Family Medicine

## 2020-05-10 ENCOUNTER — Other Ambulatory Visit: Payer: Self-pay

## 2020-05-10 ENCOUNTER — Ambulatory Visit (INDEPENDENT_AMBULATORY_CARE_PROVIDER_SITE_OTHER): Payer: Medicare HMO

## 2020-05-10 DIAGNOSIS — M79605 Pain in left leg: Secondary | ICD-10-CM | POA: Diagnosis not present

## 2020-05-10 DIAGNOSIS — M79604 Pain in right leg: Secondary | ICD-10-CM

## 2020-06-14 ENCOUNTER — Ambulatory Visit (INDEPENDENT_AMBULATORY_CARE_PROVIDER_SITE_OTHER): Payer: Medicare HMO | Admitting: Vascular Surgery

## 2020-06-14 ENCOUNTER — Other Ambulatory Visit: Payer: Self-pay

## 2020-06-14 ENCOUNTER — Encounter (INDEPENDENT_AMBULATORY_CARE_PROVIDER_SITE_OTHER): Payer: Self-pay | Admitting: Vascular Surgery

## 2020-06-14 VITALS — BP 156/80 | HR 84 | Resp 16 | Ht 73.0 in | Wt 248.0 lb

## 2020-06-14 DIAGNOSIS — M79604 Pain in right leg: Secondary | ICD-10-CM

## 2020-06-14 DIAGNOSIS — I739 Peripheral vascular disease, unspecified: Secondary | ICD-10-CM

## 2020-06-14 DIAGNOSIS — E1140 Type 2 diabetes mellitus with diabetic neuropathy, unspecified: Secondary | ICD-10-CM | POA: Diagnosis not present

## 2020-06-14 DIAGNOSIS — M79605 Pain in left leg: Secondary | ICD-10-CM

## 2020-06-14 DIAGNOSIS — M48062 Spinal stenosis, lumbar region with neurogenic claudication: Secondary | ICD-10-CM | POA: Diagnosis not present

## 2020-06-14 DIAGNOSIS — E7800 Pure hypercholesterolemia, unspecified: Secondary | ICD-10-CM

## 2020-06-14 NOTE — Progress Notes (Signed)
MRN : [id number]  John Strong is a 74 y.o. (01-13-1946) male who presents with chief complaint of  Chief Complaint  Patient presents with  . New Patient (Initial Visit)    ref Sampson Goon abnormal abi  .  History of Present Illness:   The patient is seen for evaluation of painful lower extremities. Patient notes the pain is variable and not always associated with activity.  The pain is somewhat consistent day to day occurring on most days. The patient notes the pain also occurs with standing and routinely seems worse as the day wears on. The pain has been progressive over the past several years. The patient states these symptoms are causing  a profound negative impact on quality of life and daily activities.  The patient denies rest pain or dangling of an extremity off the side of the bed during the night for relief. No open wounds or sores at this time. No history of DVT or phlebitis. No prior interventions or surgeries.  There is a  history of back problems and DJD of the lumbar and sacral spine.   ABI's are normal bilaterally  Current Meds  Medication Sig  . cyanocobalamin 1000 MCG tablet Take by mouth.  Tery Sanfilippo Sodium (DSS) 100 MG CAPS Take by mouth.  . gabapentin (NEURONTIN) 300 MG capsule 1 capsule in the am and 2 capsules at night  . hydrochlorothiazide (HYDRODIURIL) 12.5 MG tablet Take 1 tablet by mouth daily.  Marland Kitchen levothyroxine (SYNTHROID) 112 MCG tablet Take 1 tablet by mouth daily.  . metFORMIN (GLUCOPHAGE) 500 MG tablet Take 500 mg by mouth 2 (two) times daily.  Marland Kitchen NIFEdipine (ADALAT CC) 30 MG 24 hr tablet Take by mouth.  Marland Kitchen omeprazole (PRILOSEC) 20 MG capsule Take 1 tablet by mouth daily.    Past Medical History:  Diagnosis Date  . Arthritis   . Diabetes mellitus without complication (HCC)   . Hyperlipidemia   . Hypertension   . Thyroid disease      Social History Social History   Tobacco Use  . Smoking status: Former Smoker    Types: Cigarettes  .  Smokeless tobacco: Never Used  Substance Use Topics  . Alcohol use: Not Currently  . Drug use: Never    Family History Family History  Problem Relation Age of Onset  . Diabetes Mother   . Colon cancer Father   No family history of bleeding/clotting disorders, porphyria or autoimmune disease   Allergies  Allergen Reactions  . Lisinopril     Other reaction(s): Angioedema     REVIEW OF SYSTEMS (Negative unless checked)  Constitutional: [] Weight loss  [] Fever  [] Chills Cardiac: [] Chest pain   [] Chest pressure   [] Palpitations   [] Shortness of breath when laying flat   [] Shortness of breath with exertion. Vascular:  [x] Pain in legs with walking   [x] Pain in legs at rest  [] History of DVT   [] Phlebitis   [] Swelling in legs   [] Varicose veins   [] Non-healing ulcers Pulmonary:   [] Uses home oxygen   [] Productive cough   [] Hemoptysis   [] Wheeze  [] COPD   [] Asthma Neurologic:  [] Dizziness   [] Seizures   [] History of stroke   [] History of TIA  [] Aphasia   [] Vissual changes   [] Weakness or numbness in arm   [] Weakness or numbness in leg Musculoskeletal:   [] Joint swelling   [x] Joint pain   [x] Low back pain Hematologic:  [] Easy bruising  [] Easy bleeding   [] Hypercoagulable state   [] Anemic Gastrointestinal:  []   Diarrhea   [] Vomiting  [] Gastroesophageal reflux/heartburn   [] Difficulty swallowing. Genitourinary:  [] Chronic kidney disease   [] Difficult urination  [] Frequent urination   [] Blood in urine Skin:  [] Rashes   [] Ulcers  Psychological:  [] History of anxiety   []  History of major depression.  Physical Examination  Vitals:   06/14/20 1358  BP: (!) 156/80  Pulse: 84  Resp: 16  Weight: 248 lb (112.5 kg)  Height: 6\' 1"  (1.854 m)   Body mass index is 32.72 kg/m. Gen: WD/WN, NAD Head: [place]/AT, No temporalis wasting.  Ear/Nose/Throat: Hearing grossly intact, nares w/o erythema or drainage, poor dentition Eyes: PER, EOMI, sclera nonicteric.  Neck: Supple, no masses.  No bruit or JVD.   Pulmonary:  Good air movement, clear to auscultation bilaterally, no use of accessory muscles.  Cardiac: RRR, normal S1, S2, no Murmurs. Vascular:  Vessel Right Left  Radial Palpable Palpable  PT Trace Palpable Trace Palpable  DP Trace Palpable Trace Palpable  Gastrointestinal: soft, non-distended. No guarding/no peritoneal signs.  Musculoskeletal: M/S 5/5 throughout.  No deformity or atrophy.  Neurologic: CN 2-12 intact. Pain and light touch intact in extremities.  Symmetrical.  Speech is fluent. Motor exam as listed above. Psychiatric: Judgment intact, Mood & affect appropriate for pt's clinical situation. Dermatologic: No rashes or ulcers noted.  No changes consistent with cellulitis.  CBC No results found for: WBC, HGB, HCT, MCV, PLT  BMET No results found for: NA, K, CL, CO2, GLUCOSE, BUN, CREATININE, CALCIUM, GFRNONAA, GFRAA CrCl cannot be calculated (No successful lab value found.).  COAG No results found for: INR, PROTIME  Radiology No results found.   Assessment/Plan 1. Pain in both lower extremities Recommend:  I do not find evidence of Vascular pathology that would explain the patient's symptoms  The patient has atypical pain symptoms for vascular disease  I do not find evidence of Vascular pathology that would explain the patient's symptoms and I suspect the patient is c/o pseudoclaudication.  Patient should have an evaluation of his LS spine which I defer to the primary service.  Noninvasive studies including venous ultrasound of the legs do not identify vascular problems  The patient should continue walking and begin a more formal exercise program. The patient should continue his antiplatelet therapy and aggressive treatment of the lipid abnormalities.  Patient will follow-up with me on a PRN basis  Further work-up of her lower extremity pain is deferred to Neurosurgery    - Ambulatory referral to Neurosurgery  2. Spinal stenosis of lumbar region with  neurogenic claudication See #1 - Ambulatory referral to Neurosurgery  3. PAD (peripheral artery disease) (HCC) Recommend:  I do not find evidence of life style limiting vascular disease. The patient specifically denies life style limitation.  Previous noninvasive studies including ABI's of the legs do not identify critical vascular problems.  The patient should continue walking and begin a more formal exercise program. The patient should continue his antiplatelet therapy and aggressive treatment of the lipid abnormalities.  The patient should begin wearing graduated compression socks 15-20 mmHg strength to control her mild edema.  Patient will follow-up with me on a PRN basis   4. Type 2 diabetes mellitus with diabetic neuropathy, without long-term current use of insulin (HCC) Continue hypoglycemic medications as already ordered, these medications have been reviewed and there are no changes at this time.  Hgb A1C to be monitored as already arranged by primary service   5. Pure hypercholesterolemia Continue statin as ordered and reviewed, no changes  at this time     Levora Dredge, MD  06/14/2020 4:24 PM

## 2020-06-17 ENCOUNTER — Encounter (INDEPENDENT_AMBULATORY_CARE_PROVIDER_SITE_OTHER): Payer: Self-pay | Admitting: Vascular Surgery

## 2020-06-17 DIAGNOSIS — M79606 Pain in leg, unspecified: Secondary | ICD-10-CM | POA: Insufficient documentation

## 2020-06-17 DIAGNOSIS — I739 Peripheral vascular disease, unspecified: Secondary | ICD-10-CM | POA: Insufficient documentation

## 2020-06-29 ENCOUNTER — Other Ambulatory Visit: Payer: Self-pay | Admitting: Nurse Practitioner

## 2020-06-29 DIAGNOSIS — G8929 Other chronic pain: Secondary | ICD-10-CM

## 2020-06-29 DIAGNOSIS — M5416 Radiculopathy, lumbar region: Secondary | ICD-10-CM

## 2020-07-05 ENCOUNTER — Ambulatory Visit (HOSPITAL_COMMUNITY): Payer: Medicare HMO

## 2020-07-11 ENCOUNTER — Ambulatory Visit: Payer: Medicare HMO

## 2020-07-20 ENCOUNTER — Ambulatory Visit: Payer: Medicare HMO

## 2020-09-07 ENCOUNTER — Other Ambulatory Visit: Payer: Self-pay | Admitting: Nurse Practitioner

## 2020-09-07 DIAGNOSIS — M5442 Lumbago with sciatica, left side: Secondary | ICD-10-CM

## 2020-09-07 DIAGNOSIS — G8929 Other chronic pain: Secondary | ICD-10-CM

## 2020-09-15 ENCOUNTER — Other Ambulatory Visit: Payer: Self-pay

## 2020-09-15 ENCOUNTER — Ambulatory Visit
Admission: RE | Admit: 2020-09-15 | Discharge: 2020-09-15 | Disposition: A | Discharge status: Home / Self Care | Payer: Medicare HMO | Source: Ambulatory Visit | Attending: Nurse Practitioner | Admitting: Nurse Practitioner

## 2020-09-15 DIAGNOSIS — M5441 Lumbago with sciatica, right side: Secondary | ICD-10-CM | POA: Insufficient documentation

## 2020-09-15 DIAGNOSIS — M5442 Lumbago with sciatica, left side: Secondary | ICD-10-CM | POA: Insufficient documentation

## 2020-09-15 DIAGNOSIS — G8929 Other chronic pain: Secondary | ICD-10-CM | POA: Insufficient documentation

## 2020-10-12 ENCOUNTER — Other Ambulatory Visit: Payer: Self-pay | Admitting: Neurosurgery

## 2020-11-02 ENCOUNTER — Encounter
Admission: RE | Admit: 2020-11-02 | Discharge: 2020-11-02 | Disposition: A | Discharge status: Home / Self Care | Payer: Medicare HMO | Source: Ambulatory Visit | Attending: Neurosurgery | Admitting: Neurosurgery

## 2020-11-02 ENCOUNTER — Other Ambulatory Visit: Payer: Self-pay

## 2020-11-02 DIAGNOSIS — Z01818 Encounter for other preprocedural examination: Secondary | ICD-10-CM | POA: Insufficient documentation

## 2020-11-02 HISTORY — DX: Gastro-esophageal reflux disease without esophagitis: K21.9

## 2020-11-02 LAB — CBC
HCT: 40.6 % (ref 39.0–52.0)
Hemoglobin: 13.4 g/dL (ref 13.0–17.0)
MCH: 31.4 pg (ref 26.0–34.0)
MCHC: 33 g/dL (ref 30.0–36.0)
MCV: 95.1 fL (ref 80.0–100.0)
Platelets: 160 10*3/uL (ref 150–400)
RBC: 4.27 MIL/uL (ref 4.22–5.81)
RDW: 12 % (ref 11.5–15.5)
WBC: 7 10*3/uL (ref 4.0–10.5)
nRBC: 0 % (ref 0.0–0.2)

## 2020-11-02 LAB — URINALYSIS, ROUTINE W REFLEX MICROSCOPIC
Bilirubin Urine: NEGATIVE
Glucose, UA: NEGATIVE mg/dL
Hgb urine dipstick: NEGATIVE
Ketones, ur: NEGATIVE mg/dL
Leukocytes,Ua: NEGATIVE
Nitrite: NEGATIVE
Protein, ur: NEGATIVE mg/dL
Specific Gravity, Urine: 1.023 (ref 1.005–1.030)
pH: 5 (ref 5.0–8.0)

## 2020-11-02 LAB — BASIC METABOLIC PANEL
Anion gap: 8 (ref 5–15)
BUN: 12 mg/dL (ref 8–23)
CO2: 26 mmol/L (ref 22–32)
Calcium: 9.4 mg/dL (ref 8.9–10.3)
Chloride: 102 mmol/L (ref 98–111)
Creatinine, Ser: 0.79 mg/dL (ref 0.61–1.24)
GFR, Estimated: >60 mL/min (ref >60)
Glucose, Bld: 119 mg/dL — ABNORMAL HIGH (ref 70–99)
Potassium: 3.8 mmol/L (ref 3.5–5.1)
Sodium: 136 mmol/L (ref 135–145)

## 2020-11-02 LAB — PROTIME-INR
INR: 1 (ref 0.8–1.2)
Prothrombin Time: 12.7 seconds (ref 11.4–15.2)

## 2020-11-02 LAB — SURGICAL PCR SCREEN
MRSA, PCR: NEGATIVE
Staphylococcus aureus: NEGATIVE

## 2020-11-02 LAB — TYPE AND SCREEN
ABO/RH(D): O POS
Antibody Screen: NEGATIVE

## 2020-11-02 LAB — APTT: aPTT: 27 seconds (ref 24–36)

## 2020-11-02 NOTE — Patient Instructions (Signed)
Your procedure is scheduled on: November 14, 2020 [facility] Report to the Registration Desk on the 1st floor of the [facility]. To find out your arrival time, please call [phone] between 1PM - 3PM on: November 13, 2020 TUESDAY  REMEMBER: Instructions that are not followed completely may result in serious medical risk, up to and including death; or upon the discretion of your surgeon and anesthesiologist your surgery may need to be rescheduled.  Do not eat food after midnight the night before surgery.  No gum chewing, lozengers or hard candies.  You may however, drink CLEAR liquids up to 2 hours before you are scheduled to arrive for your surgery. Do not drink anything within 2 hours of your scheduled arrival time.  Clear liquids include: - water   Do NOT drink anything that is not on this list.  Type 1 and Type 2 diabetics should only drink water.  TAKE THESE MEDICATIONS THE MORNING OF SURGERY WITH A SIP OF WATER: DOCUSATE SODIUM LEVOTHYROXINE GABAPENTIN NIFEDIPINE ROSUVASTATIN OMEPRAZOLE (take one the night before and one on the morning of surgery - helps to prevent nausea after surgery.)  Stop Metformin 2 days prior to surgery. LAST DOSE 11/11/2020  One week prior to surgery: Stop Anti-inflammatories (NSAIDS) such as Advil, Aleve, Ibuprofen, Motrin, Naproxen, Naprosyn and Aspirin based products such as Excedrin, Goodys Powder, BC Powder.  USE TYLENOL IF NEEDED. Stop ANY OVER THE COUNTER supplements until after surgery. YOU MAY USE VIT B UNTIL SURGERY BUT NOT DAY OF SURGERY  No Alcohol for 24 hours before or after surgery.  No Smoking including e-cigarettes for 24 hours prior to surgery.  No chewable tobacco products for at least 6 hours prior to surgery.  No nicotine patches on the day of surgery.  Do not use any "recreational" drugs for at least a week prior to your surgery.  Please be advised that the combination of cocaine and anesthesia may have negative outcomes, up  to and including death. If you test positive for cocaine, your surgery will be cancelled.  On the morning of surgery brush your teeth with toothpaste and water, you may rinse your mouth with mouthwash if you wish. Do not swallow any toothpaste or mouthwash.  Do not wear jewelry, make-up, hairpins, clips or nail polish.  Do not wear lotions, powders, or perfumes.   Do not shave body from the neck down 48 hours prior to surgery just in case you cut yourself which could leave a site for infection.  Also, freshly shaved skin may become irritated if using the CHG soap.  Contact lenses, hearing aids and dentures may not be worn into surgery.  Do not bring valuables to the hospital. [facility] is not responsible for any missing/lost belongings or valuables.   Use CHG Soap as directed on instruction sheet.  Notify your doctor if there is any change in your medical condition (cold, fever, infection).  Wear comfortable clothing (specific to your surgery type) to the hospital.  Plan for stool softeners for home use; pain medications have a tendency to cause constipation. You can also help prevent constipation by eating foods high in fiber such as fruits and vegetables and drinking plenty of fluids as your diet allows.  After surgery, you can help prevent lung complications by doing breathing exercises.  Take deep breaths and cough every 1-2 hours. Your doctor may order a device called an Incentive Spirometer to help you take deep breaths. When coughing or sneezing, hold a pillow firmly against  your incision with both hands. This is called "splinting." Doing this helps protect your incision. It also decreases belly discomfort.  If you are being admitted to the hospital overnight, YOU MAY BRING A SMALL BAG WITH YOU.   If you are being discharged the day of surgery, you will not be allowed to drive home. You will need a responsible adult (18 years or older) to drive you home and stay with you  that night.   If you are taking public transportation, you will need to have a responsible adult (18 years or older) with you. Please confirm with your physician that it is acceptable to use public transportation.   Please call the Pre-admissions Testing Dept. at [phone] if you have any questions about these instructions.  Surgery Visitation Policy:  Patients undergoing a surgery or procedure may have one family member or support person with them as long as that person is not COVID-19 positive or experiencing its symptoms.  That person may remain in the waiting area during the procedure.  Inpatient Visitation:    Visiting hours are 7 a.m. to 8 p.m. Inpatients will be allowed two visitors daily. The visitors may change each day during the patient's stay. No visitors under the age of 23. Any visitor under the age of 85 must be accompanied by an adult. The visitor must pass COVID-19 screenings, use hand sanitizer when entering and exiting the patient's room and wear a mask at all times, including in the patient's room. Patients must also wear a mask when staff or their visitor are in the room. Masking is required regardless of vaccination status.

## 2020-11-12 ENCOUNTER — Other Ambulatory Visit
Admission: RE | Admit: 2020-11-12 | Discharge: 2020-11-12 | Disposition: A | Discharge status: Home / Self Care | Payer: Medicare HMO | Source: Ambulatory Visit | Attending: Neurosurgery | Admitting: Neurosurgery

## 2020-11-12 ENCOUNTER — Other Ambulatory Visit: Payer: Self-pay

## 2020-11-12 DIAGNOSIS — Z01812 Encounter for preprocedural laboratory examination: Secondary | ICD-10-CM | POA: Insufficient documentation

## 2020-11-12 DIAGNOSIS — Z20822 Contact with and (suspected) exposure to covid-19: Secondary | ICD-10-CM | POA: Insufficient documentation

## 2020-11-12 LAB — SARS CORONAVIRUS 2 (TAT 6-24 HRS): SARS Coronavirus 2: NEGATIVE

## 2020-11-14 ENCOUNTER — Inpatient Hospital Stay: Payer: Medicare HMO

## 2020-11-14 ENCOUNTER — Other Ambulatory Visit: Payer: Self-pay

## 2020-11-14 ENCOUNTER — Encounter: Payer: Self-pay | Admitting: Neurosurgery

## 2020-11-14 ENCOUNTER — Inpatient Hospital Stay
Admission: RE | Admit: 2020-11-14 | Discharge: 2020-11-15 | DRG: 520 | Disposition: A | Discharge status: Home / Self Care | Payer: Medicare HMO | Attending: Neurosurgery | Admitting: Neurosurgery

## 2020-11-14 ENCOUNTER — Inpatient Hospital Stay: Payer: Medicare HMO | Admitting: Certified Registered"

## 2020-11-14 ENCOUNTER — Encounter
Admission: RE | Disposition: A | Discharge status: Home / Self Care | Payer: Self-pay | Source: Home / Self Care | Attending: Neurosurgery

## 2020-11-14 DIAGNOSIS — Z87891 Personal history of nicotine dependence: Secondary | ICD-10-CM | POA: Diagnosis not present

## 2020-11-14 DIAGNOSIS — Z7984 Long term (current) use of oral hypoglycemic drugs: Secondary | ICD-10-CM

## 2020-11-14 DIAGNOSIS — M48062 Spinal stenosis, lumbar region with neurogenic claudication: Principal | ICD-10-CM | POA: Diagnosis present

## 2020-11-14 DIAGNOSIS — Z8042 Family history of malignant neoplasm of prostate: Secondary | ICD-10-CM

## 2020-11-14 DIAGNOSIS — E119 Type 2 diabetes mellitus without complications: Secondary | ICD-10-CM | POA: Diagnosis present

## 2020-11-14 DIAGNOSIS — M4316 Spondylolisthesis, lumbar region: Secondary | ICD-10-CM | POA: Diagnosis present

## 2020-11-14 DIAGNOSIS — Z79890 Hormone replacement therapy (postmenopausal): Secondary | ICD-10-CM

## 2020-11-14 DIAGNOSIS — Z833 Family history of diabetes mellitus: Secondary | ICD-10-CM

## 2020-11-14 DIAGNOSIS — E785 Hyperlipidemia, unspecified: Secondary | ICD-10-CM | POA: Diagnosis present

## 2020-11-14 DIAGNOSIS — Z20822 Contact with and (suspected) exposure to covid-19: Secondary | ICD-10-CM | POA: Diagnosis present

## 2020-11-14 DIAGNOSIS — Z888 Allergy status to other drugs, medicaments and biological substances status: Secondary | ICD-10-CM | POA: Diagnosis not present

## 2020-11-14 DIAGNOSIS — Z79899 Other long term (current) drug therapy: Secondary | ICD-10-CM | POA: Diagnosis not present

## 2020-11-14 DIAGNOSIS — Z419 Encounter for procedure for purposes other than remedying health state, unspecified: Secondary | ICD-10-CM

## 2020-11-14 DIAGNOSIS — E890 Postprocedural hypothyroidism: Secondary | ICD-10-CM | POA: Diagnosis present

## 2020-11-14 DIAGNOSIS — I10 Essential (primary) hypertension: Secondary | ICD-10-CM | POA: Diagnosis present

## 2020-11-14 HISTORY — PX: LUMBAR LAMINECTOMY/DECOMPRESSION MICRODISCECTOMY: SHX5026

## 2020-11-14 LAB — GLUCOSE, CAPILLARY
Glucose-Capillary: 142 mg/dL — ABNORMAL HIGH (ref 70–99)
Glucose-Capillary: 121 mg/dL — ABNORMAL HIGH (ref 70–99)

## 2020-11-14 SURGERY — LUMBAR LAMINECTOMY/DECOMPRESSION MICRODISCECTOMY 3 LEVELS
Anesthesia: General

## 2020-11-14 MED ORDER — HYDROMORPHONE HCL 1 MG/ML IJ SOLN: 0.5000 mg | INTRAMUSCULAR | Status: DC | PRN

## 2020-11-14 MED ORDER — GABAPENTIN 300 MG PO CAPS
300.0000 mg | ORAL_CAPSULE | Freq: Every day | ORAL | Status: DC
  Administered 2020-11-15: 300 mg via ORAL
  Filled 2020-11-14: qty 1

## 2020-11-14 MED ORDER — VITAMIN B-12 1000 MCG PO TABS
1000.0000 ug | ORAL_TABLET | Freq: Every day | ORAL | Status: DC
  Administered 2020-11-15: 1000 ug via ORAL
  Filled 2020-11-14: qty 1

## 2020-11-14 MED ORDER — OXYCODONE HCL 5 MG PO TABS
5.0000 mg | ORAL_TABLET | ORAL | Status: DC | PRN
  Administered 2020-11-14 – 2020-11-15 (×2): 5 mg via ORAL
  Filled 2020-11-14 (×2): qty 1

## 2020-11-14 MED ORDER — ROSUVASTATIN CALCIUM 10 MG PO TABS
10.0000 mg | ORAL_TABLET | Freq: Every day | ORAL | Status: DC
  Administered 2020-11-15: 10 mg via ORAL
  Filled 2020-11-14: qty 1

## 2020-11-14 MED ORDER — SODIUM CHLORIDE 0.9 % IV SOLN: 250.0000 mL | INTRAVENOUS | Status: DC

## 2020-11-14 MED ORDER — HYDROCHLOROTHIAZIDE 25 MG PO TABS
12.5000 mg | ORAL_TABLET | Freq: Every day | ORAL | Status: DC
  Administered 2020-11-15: 12.5 mg via ORAL
  Filled 2020-11-14: qty 1

## 2020-11-14 MED ORDER — REMIFENTANIL HCL 1 MG IV SOLR
INTRAVENOUS | Status: AC
  Filled 2020-11-14: qty 1000

## 2020-11-14 MED ORDER — SUCCINYLCHOLINE CHLORIDE 20 MG/ML IJ SOLN
INTRAMUSCULAR | Status: DC | PRN
  Administered 2020-11-14: 120 mg via INTRAVENOUS

## 2020-11-14 MED ORDER — GABAPENTIN 300 MG PO CAPS: 300.0000 mg | ORAL_CAPSULE | ORAL | Status: DC

## 2020-11-14 MED ORDER — METHYLPREDNISOLONE ACETATE 40 MG/ML IJ SUSP
INTRAMUSCULAR | Status: DC | PRN
  Administered 2020-11-14: 40 mg

## 2020-11-14 MED ORDER — LACTATED RINGERS IV SOLN: INTRAVENOUS | Status: DC | PRN

## 2020-11-14 MED ORDER — ACETAMINOPHEN 10 MG/ML IV SOLN
INTRAVENOUS | Status: AC
  Filled 2020-11-14: qty 100

## 2020-11-14 MED ORDER — PANTOPRAZOLE SODIUM 40 MG PO TBEC
40.0000 mg | DELAYED_RELEASE_TABLET | Freq: Every day | ORAL | Status: DC
  Administered 2020-11-15: 40 mg via ORAL
  Filled 2020-11-14: qty 1

## 2020-11-14 MED ORDER — PROPOFOL 10 MG/ML IV BOLUS
INTRAVENOUS | Status: AC
  Filled 2020-11-14: qty 20

## 2020-11-14 MED ORDER — SODIUM CHLORIDE 0.9 % IV SOLN
INTRAVENOUS | Status: DC | PRN
  Administered 2020-11-14: 40 mL

## 2020-11-14 MED ORDER — SODIUM CHLORIDE 0.9% FLUSH
3.0000 mL | Freq: Two times a day (BID) | INTRAVENOUS | Status: DC
  Administered 2020-11-14 – 2020-11-15 (×3): 3 mL via INTRAVENOUS

## 2020-11-14 MED ORDER — CHLORHEXIDINE GLUCONATE 0.12 % MT SOLN
OROMUCOSAL | Status: AC
  Administered 2020-11-14: 15 mL via OROMUCOSAL
  Filled 2020-11-14: qty 15

## 2020-11-14 MED ORDER — BISACODYL 5 MG PO TBEC: 5.0000 mg | DELAYED_RELEASE_TABLET | Freq: Every day | ORAL | Status: DC | PRN

## 2020-11-14 MED ORDER — METHOCARBAMOL 1000 MG/10ML IJ SOLN
500.0000 mg | Freq: Four times a day (QID) | INTRAVENOUS | Status: DC | PRN
  Administered 2020-11-14: 500 mg via INTRAVENOUS
  Filled 2020-11-14: qty 5

## 2020-11-14 MED ORDER — ONDANSETRON HCL 4 MG/2ML IJ SOLN: 4.0000 mg | Freq: Four times a day (QID) | INTRAMUSCULAR | Status: DC | PRN

## 2020-11-14 MED ORDER — THROMBIN 5000 UNITS EX SOLR
CUTANEOUS | Status: DC | PRN
  Administered 2020-11-14: 5000 [IU] via TOPICAL

## 2020-11-14 MED ORDER — FENTANYL CITRATE (PF) 100 MCG/2ML IJ SOLN
25.0000 ug | INTRAMUSCULAR | Status: AC | PRN
  Administered 2020-11-14 (×6): 25 ug via INTRAVENOUS

## 2020-11-14 MED ORDER — ACETAMINOPHEN 10 MG/ML IV SOLN
INTRAVENOUS | Status: DC | PRN
  Administered 2020-11-14: 1000 mg via INTRAVENOUS

## 2020-11-14 MED ORDER — METHOCARBAMOL 500 MG PO TABS: 500.0000 mg | ORAL_TABLET | Freq: Four times a day (QID) | ORAL | Status: DC | PRN

## 2020-11-14 MED ORDER — PROPOFOL 500 MG/50ML IV EMUL
INTRAVENOUS | Status: DC | PRN
  Administered 2020-11-14: 125 ug/kg/min via INTRAVENOUS

## 2020-11-14 MED ORDER — LIDOCAINE HCL (CARDIAC) PF 100 MG/5ML IV SOSY
PREFILLED_SYRINGE | INTRAVENOUS | Status: DC | PRN
  Administered 2020-11-14: 100 mg via INTRAVENOUS

## 2020-11-14 MED ORDER — MENTHOL 3 MG MT LOZG
1.0000 | LOZENGE | OROMUCOSAL | Status: DC | PRN
  Filled 2020-11-14: qty 9

## 2020-11-14 MED ORDER — ONDANSETRON HCL 4 MG PO TABS: 4.0000 mg | ORAL_TABLET | Freq: Four times a day (QID) | ORAL | Status: DC | PRN

## 2020-11-14 MED ORDER — METFORMIN HCL 500 MG PO TABS
500.0000 mg | ORAL_TABLET | Freq: Two times a day (BID) | ORAL | Status: DC
  Administered 2020-11-14 – 2020-11-15 (×2): 500 mg via ORAL
  Filled 2020-11-14 (×2): qty 1

## 2020-11-14 MED ORDER — ACETAMINOPHEN 500 MG PO TABS
1000.0000 mg | ORAL_TABLET | Freq: Four times a day (QID) | ORAL | Status: AC
  Administered 2020-11-14 – 2020-11-15 (×3): 1000 mg via ORAL
  Filled 2020-11-14 (×3): qty 2

## 2020-11-14 MED ORDER — DOCUSATE SODIUM 100 MG PO CAPS
100.0000 mg | ORAL_CAPSULE | Freq: Two times a day (BID) | ORAL | Status: DC
  Administered 2020-11-14 – 2020-11-15 (×2): 100 mg via ORAL
  Filled 2020-11-14 (×2): qty 1

## 2020-11-14 MED ORDER — PHENYLEPHRINE HCL (PRESSORS) 10 MG/ML IV SOLN
INTRAVENOUS | Status: DC | PRN
  Administered 2020-11-14 (×2): 100 ug via INTRAVENOUS

## 2020-11-14 MED ORDER — CEFAZOLIN SODIUM-DEXTROSE 2-4 GM/100ML-% IV SOLN
INTRAVENOUS | Status: AC
  Filled 2020-11-14: qty 100

## 2020-11-14 MED ORDER — SENNOSIDES-DOCUSATE SODIUM 8.6-50 MG PO TABS: 1.0000 | ORAL_TABLET | Freq: Every evening | ORAL | Status: DC | PRN

## 2020-11-14 MED ORDER — BUPIVACAINE-EPINEPHRINE (PF) 0.5% -1:200000 IJ SOLN
INTRAMUSCULAR | Status: DC | PRN
  Administered 2020-11-14: 4 mL

## 2020-11-14 MED ORDER — PROPOFOL 10 MG/ML IV BOLUS
INTRAVENOUS | Status: DC | PRN
  Administered 2020-11-14: 160 mg via INTRAVENOUS
  Administered 2020-11-14: 40 mg via INTRAVENOUS

## 2020-11-14 MED ORDER — CHLORHEXIDINE GLUCONATE 0.12 % MT SOLN: 15.0000 mL | Freq: Once | OROMUCOSAL | Status: AC

## 2020-11-14 MED ORDER — OXYCODONE HCL 5 MG PO TABS: 10.0000 mg | ORAL_TABLET | ORAL | Status: DC | PRN

## 2020-11-14 MED ORDER — BUPIVACAINE HCL (PF) 0.5 % IJ SOLN
INTRAMUSCULAR | Status: DC | PRN
  Administered 2020-11-14: 20 mL

## 2020-11-14 MED ORDER — SODIUM CHLORIDE 0.9 % IV SOLN: INTRAVENOUS | Status: DC

## 2020-11-14 MED ORDER — LEVOTHYROXINE SODIUM 112 MCG PO TABS
112.0000 ug | ORAL_TABLET | Freq: Every day | ORAL | Status: DC
  Administered 2020-11-15: 112 ug via ORAL
  Filled 2020-11-14: qty 1

## 2020-11-14 MED ORDER — SODIUM CHLORIDE 0.9% FLUSH: 3.0000 mL | INTRAVENOUS | Status: DC | PRN

## 2020-11-14 MED ORDER — PHENYLEPHRINE HCL-NACL 10-0.9 MG/250ML-% IV SOLN
INTRAVENOUS | Status: DC | PRN
  Administered 2020-11-14: 30 ug/min via INTRAVENOUS

## 2020-11-14 MED ORDER — PROPOFOL 500 MG/50ML IV EMUL
INTRAVENOUS | Status: AC
  Filled 2020-11-14: qty 50

## 2020-11-14 MED ORDER — REMIFENTANIL HCL 1 MG IV SOLR
INTRAVENOUS | Status: DC | PRN
  Administered 2020-11-14: .1 ug/kg/min via INTRAVENOUS

## 2020-11-14 MED ORDER — CELECOXIB 200 MG PO CAPS
200.0000 mg | ORAL_CAPSULE | Freq: Two times a day (BID) | ORAL | Status: DC
  Administered 2020-11-14 – 2020-11-15 (×2): 200 mg via ORAL
  Filled 2020-11-14 (×2): qty 1

## 2020-11-14 MED ORDER — NIFEDIPINE ER OSMOTIC RELEASE 30 MG PO TB24
30.0000 mg | ORAL_TABLET | Freq: Two times a day (BID) | ORAL | Status: DC
  Administered 2020-11-14 – 2020-11-15 (×2): 30 mg via ORAL
  Filled 2020-11-14 (×3): qty 1

## 2020-11-14 MED ORDER — GABAPENTIN 600 MG PO TABS
600.0000 mg | ORAL_TABLET | Freq: Every day | ORAL | Status: DC
Start: 2020-11-14 — End: 2020-11-15
  Administered 2020-11-14: 600 mg via ORAL
  Filled 2020-11-14: qty 1

## 2020-11-14 MED ORDER — ONDANSETRON HCL 4 MG/2ML IJ SOLN
INTRAMUSCULAR | Status: DC | PRN
  Administered 2020-11-14: 4 mg via INTRAVENOUS

## 2020-11-14 MED ORDER — FENTANYL CITRATE (PF) 100 MCG/2ML IJ SOLN
INTRAMUSCULAR | Status: DC | PRN
  Administered 2020-11-14: 100 ug via INTRAVENOUS

## 2020-11-14 MED ORDER — ORAL CARE MOUTH RINSE: 15.0000 mL | Freq: Once | OROMUCOSAL | Status: AC

## 2020-11-14 MED ORDER — DEXAMETHASONE SODIUM PHOSPHATE 10 MG/ML IJ SOLN
INTRAMUSCULAR | Status: DC | PRN
  Administered 2020-11-14: 10 mg via INTRAVENOUS

## 2020-11-14 MED ORDER — FENTANYL CITRATE (PF) 100 MCG/2ML IJ SOLN
INTRAMUSCULAR | Status: AC
  Filled 2020-11-14: qty 2

## 2020-11-14 MED ORDER — PHENOL 1.4 % MT LIQD
1.0000 | OROMUCOSAL | Status: DC | PRN
  Filled 2020-11-14: qty 177

## 2020-11-14 MED ORDER — FENTANYL CITRATE (PF) 100 MCG/2ML IJ SOLN
INTRAMUSCULAR | Status: AC
  Administered 2020-11-14: 25 ug via INTRAVENOUS
  Filled 2020-11-14: qty 2

## 2020-11-14 MED ORDER — POTASSIUM CHLORIDE IN NACL 20-0.9 MEQ/L-% IV SOLN
INTRAVENOUS | Status: DC
  Administered 2020-11-14: 1000 mL via INTRAVENOUS
  Filled 2020-11-14 (×5): qty 1000

## 2020-11-14 MED ORDER — CEFAZOLIN SODIUM-DEXTROSE 2-4 GM/100ML-% IV SOLN
2.0000 g | Freq: Once | INTRAVENOUS | Status: AC
  Administered 2020-11-14: 2 g via INTRAVENOUS

## 2020-11-14 MED ORDER — ONDANSETRON HCL 4 MG/2ML IJ SOLN: 4.0000 mg | Freq: Once | INTRAMUSCULAR | Status: DC | PRN

## 2020-11-14 SURGICAL SUPPLY — 56 items
BUR NEURO DRILL SOFT 3.0X3.8M (BURR) ×2 IMPLANT
CANISTER SUCT 1200ML W/VALVE (MISCELLANEOUS) IMPLANT
CHLORAPREP W/TINT 26 (MISCELLANEOUS) ×4 IMPLANT
CNTNR SPEC 2.5X3XGRAD LEK (MISCELLANEOUS) ×1
CONT SPEC 4OZ STER OR WHT (MISCELLANEOUS) ×1
CONTAINER SPEC 2.5X3XGRAD LEK (MISCELLANEOUS) ×1 IMPLANT
COUNTER NEEDLE 20/40 LG (NEEDLE) ×2 IMPLANT
COVER WAND RF STERILE (DRAPES) ×2 IMPLANT
CUP MEDICINE 2OZ PLAST GRAD ST (MISCELLANEOUS) ×4 IMPLANT
DERMABOND ADVANCED (GAUZE/BANDAGES/DRESSINGS) ×1
DERMABOND ADVANCED .7 DNX12 (GAUZE/BANDAGES/DRESSINGS) ×1 IMPLANT
DRAPE C ARM PK CFD 31 SPINE (DRAPES) ×4 IMPLANT
DRAPE LAPAROTOMY 100X77 ABD (DRAPES) ×2 IMPLANT
DRAPE MICROSCOPE SPINE 48X150 (DRAPES) ×2 IMPLANT
DRAPE SURG 17X11 SM STRL (DRAPES) ×8 IMPLANT
DRSG TEGADERM 4X4.75 (GAUZE/BANDAGES/DRESSINGS) ×2 IMPLANT
DRSG TELFA 4X3 1S NADH ST (GAUZE/BANDAGES/DRESSINGS) ×2 IMPLANT
ELECT CAUTERY BLADE TIP 2.5 (TIP) ×2
ELECT EZSTD 165MM 6.5IN (MISCELLANEOUS) ×2
ELECTRODE CAUTERY BLDE TIP 2.5 (TIP) ×1 IMPLANT
ELECTRODE EZSTD 165MM 6.5IN (MISCELLANEOUS) ×1 IMPLANT
GLOVE SURG SYN 7.0 (GLOVE) ×4 IMPLANT
GLOVE SURG SYN 8.5  E (GLOVE) ×3
GLOVE SURG SYN 8.5 E (GLOVE) ×3 IMPLANT
GLOVE SURG UNDER POLY LF SZ7 (GLOVE) ×2 IMPLANT
GOWN SRG XL LVL 3 NONREINFORCE (GOWNS) ×1 IMPLANT
GOWN STRL NON-REIN TWL XL LVL3 (GOWNS) ×1
GOWN STRL REUS W/ TWL XL LVL3 (GOWN DISPOSABLE) ×1 IMPLANT
GOWN STRL REUS W/TWL XL LVL3 (GOWN DISPOSABLE) ×1
GRADUATE 1200CC STRL 31836 (MISCELLANEOUS) ×4 IMPLANT
GRAFT DURAGEN MATRIX 1WX1L (Tissue) ×2 IMPLANT
KIT SPINAL PRONEVIEW (KITS) ×2 IMPLANT
KNIFE BAYONET SHORT DISCETOMY (MISCELLANEOUS) IMPLANT
MANIFOLD NEPTUNE II (INSTRUMENTS) ×2 IMPLANT
MARKER SKIN DUAL TIP RULER LAB (MISCELLANEOUS) ×4 IMPLANT
NDL SAFETY ECLIPSE 18X1.5 (NEEDLE) IMPLANT
NEEDLE HYPO 18GX1.5 SHARP (NEEDLE)
NEEDLE HYPO 22GX1.5 SAFETY (NEEDLE) ×2 IMPLANT
NS IRRIG 1000ML POUR BTL (IV SOLUTION) ×2 IMPLANT
PACK LAMINECTOMY NEURO (CUSTOM PROCEDURE TRAY) ×2 IMPLANT
PAD ARMBOARD 7.5X6 YLW CONV (MISCELLANEOUS) ×2 IMPLANT
SPOGE SURGIFLO 8M (HEMOSTASIS) ×1
SPONGE SURGIFLO 8M (HEMOSTASIS) ×1 IMPLANT
SUT DVC VLOC 3-0 CL 6 P-12 (SUTURE) ×2 IMPLANT
SUT ETHILON 3-0 FS-10 30 BLK (SUTURE) ×2
SUT VIC AB 0 CT1 27 (SUTURE) ×2
SUT VIC AB 0 CT1 27XCR 8 STRN (SUTURE) ×2 IMPLANT
SUT VIC AB 2-0 CT1 18 (SUTURE) ×6 IMPLANT
SUTURE EHLN 3-0 FS-10 30 BLK (SUTURE) ×1 IMPLANT
SYR 20ML LL LF (SYRINGE) ×2 IMPLANT
SYR 30ML LL (SYRINGE) ×4 IMPLANT
SYR 3ML LL SCALE MARK (SYRINGE) ×2 IMPLANT
TISSEEL 4ML HEMOSTATIC FIBRIN (Miscellaneous) ×2 IMPLANT
TOWEL OR 17X26 4PK STRL BLUE (TOWEL DISPOSABLE) ×6 IMPLANT
TUBING CONNECTING 10 (TUBING) ×2 IMPLANT
WATER STERILE IRR 1000ML POUR (IV SOLUTION) ×2 IMPLANT

## 2020-11-14 NOTE — H&P (Signed)
History of Present Illness: 11/14/2020 Mr. Banks presents today with continued pain in his legs as noted below.    09/25/2020 Mr. Matej Sappenfield is here today with a chief complaint of low back pain and bilateral lower extremity pain that radiates into both feet. The left leg is worse than the right. Both feet are numb.   He particular has trouble with walking. He can walk approximately 100 feet before he has pain that requires him to sit down. Is been worsening over the past 6 months. His pain is as bad as 8 out of 10. Is made better by sitting or resting. He denies weakness or bowel bladder dysfunction.  He has been doing physical therapy for 2 months without improvement.  Conservative measures:  EMG on 07/16/20 by Dr. Malvin Johns Physical therapy: has participated in 7 visits Multimodal medical therapy including regular antiinflammatories: gabapentin, tylenol Injections: has not had any epidural steroid injections  Past Surgery: none  Taiki Buckwalter has no symptoms of cervical myelopathy.  The symptoms are causing a significant impact on the patient's life.   Progress Note from Patsey Berthold, NP on 09/06/2020  Chief Complaint: Back pain and bilateral lower extremity pain  History of Present Illness: Mr. Cail returns to clinic today to review the results of his EMG. He has participated in physical therapy, has had approximately 6 visits and does not feel that this is helping his pain. 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 In October we had attempted to get an MRI however it was denied by insurance for failure to try conservative management.  He continues to endorse diffuse numbness and tingling throughout his bilateral lower extremities, and does say today that he has noticed pain which goes down his anterior left thigh  and down to his left foot, with numbness. He said that he continues to have increased pain after walking for short periods, he said that walking from the parking lot to the store causes pain to increase and he needs to lean over on a shopping cart to decrease some of the pain. He also says that from his house to the mailbox his pain begins to ache in his legs and he needs to pause to decrease some of the symptoms. He continues to deny any urinary symptoms or saddle anesthesia. He has taken gabapentin and has been increasing it at night per his providers request but has not noted much of a difference.   Interval History  06/22/2020 visit with Wynona Canes Zdeb: Jobin Montelongo is a 75 y.o. male who presents with the chief complaint of lower back and bilateral lower extremity pain. He endorses that his lower back is not as bothersome however his lower extremity pain is extremely bothersome, he endorses that he has lower extremity pain "all over" his lower extremities, front back lateral and medial aspects of his legs on both sides. He says that this feels numb, tingling, weak. He said that he can only walk for about 10 minutes then he has to stop. He has been referred to our office by Dr. Gilda Crease of [facility]. The pain has been going on for about 6 months and is worsening, it is constant. Currently a 6 out of 10, at best a 7 out of 10 and at worst an 8 out of 10. Walking, standing worsen the pain, while rest, sitting improves some  of the pain.   Past Spine Surgery: none  The patient has had no recent trauma, falls, or injury . The patient has no history of cancer  The patient is not experiencing any bowel or bladder dysfunction, new lower extremity weakness, or saddle anesthesia .  Conservative management Conservative measures:rest, sitting down Physical therapy:none Multimodal medical therapy: gabapentin, tylenol Injections: none  Review of Systems:  A 10 point review of  systems is negative, except for the pertinent positives and negatives detailed in the HPI.  Past Medical History: Past Medical History:  Diagnosis Date  . Diabetes mellitus, type 2 (CMS-HCC) 2001  . Hyperlipidemia, acquired  . Hypertension 2000  . Hypothyroidism (acquired), unspecified   Past Surgical History: Past Surgical History:  Procedure Laterality Date  . COLONOSCOPY 09/06/2018  Normal colon/PHx CP/Repeat 58yrs/TKT  . THYROIDECTOMY FOR REMOVAL REMAINING TISSUE Left    Allergies  Allergen Reactions  . Lisinopril     Other reaction(s): Angioedema    Current Meds  Medication Sig  . cyanocobalamin 1000 MCG tablet Take 1,000 mcg by mouth daily.  Tery Sanfilippo Sodium (DSS) 100 MG CAPS Take 50 mg by mouth 2 (two) times daily.  Marland Kitchen gabapentin (NEURONTIN) 300 MG capsule Take 300-600 mg by mouth See admin instructions. Tale 300 mg in the morning and 600 mg at bedtime  . hydrochlorothiazide (HYDRODIURIL) 12.5 MG tablet Take 12.5 mg by mouth daily.  Marland Kitchen levothyroxine (SYNTHROID) 112 MCG tablet Take 112 mcg by mouth daily.  . metFORMIN (GLUCOPHAGE) 500 MG tablet Take 500 mg by mouth 2 (two) times daily.  Marland Kitchen NIFEdipine (ADALAT CC) 30 MG 24 hr tablet Take 30 mg by mouth 2 (two) times daily.  Marland Kitchen omeprazole (PRILOSEC) 20 MG capsule Take 20 mg by mouth daily.  . rosuvastatin (CRESTOR) 10 MG tablet Take 10 mg by mouth at bedtime.    Social History: Social History   Tobacco Use  . Smoking status: Former Smoker  Packs/day: 1.00  Years: 20.00  Pack years: 20.00  Types: Cigarettes  Quit date: 08/25/1973  Years since quitting: 47.1  . Smokeless tobacco: Never Used  . Tobacco comment: Smoked 1 pack of cigarettes every day or day & a half  Vaping Use  . Vaping Use: Never used  Substance Use Topics  . Alcohol use: Not Currently  . Drug use: Never   Family Medical History: Family History  Problem Relation Age of Onset  . Diabetes Mother  . Prostate cancer Father   Physical  Examination:  Vitals:   11/14/20 0624  BP: (!) 161/85  Pulse: 82  Resp: 16  Temp: (!) 96.7 F (35.9 C)  SpO2: 97%     Heart sounds normal no MRG. Chest Clear to Auscultation Bilaterally.   General: Patient is well developed, well nourished, calm, collected, and in no apparent distress. Attention to examination is appropriate.  Psychiatric: Patient is non-anxious.  Head: Pupils equal, round, and reactive to light.  ENT: Oral mucosa appears well hydrated.  Neck: Supple. Full range of motion.  Respiratory: Patient is breathing without any difficulty.  Extremities: No edema.  Vascular: Palpable dorsal pedal pulses.  Skin: On exposed skin, there are no abnormal skin lesions.  NEUROLOGICAL:   Awake, alert, oriented to person, place, and time. Speech is clear and fluent. Fund of knowledge is appropriate.   Cranial Nerves: Pupils equal round and reactive to light. Facial tone is symmetric. Facial sensation is symmetric. Shoulder shrug is symmetric. Tongue protrusion is midline. There is no pronator drift.  Strength: Side Biceps Triceps Deltoid Interossei Grip Wrist Ext. Wrist Flex.  R 5 5 5 5 5 5 5   L 5 5 5 5 5 5 5    Side Iliopsoas Quads Hamstring PF DF EHL  R 5 5 5 5 5 5   L 5 5 5 5 5 5    Reflexes are 1+ and symmetric at the biceps, triceps, brachioradialis, patella and achilles. Hoffman's is absent. Clonus is not present. Toes are down-going.  Bilateral upper and lower extremity sensation is intact to light touch.  Gait is slightly stooped forward. No evidence of dysmetria noted.  Medical Decision Making  Imaging: MRI L spine 09/15/20 IMPRESSION:  1. Multilevel degenerative disease exacerbated by short pedicles and  L3-4 anterolisthesis.  2. L3-4 severe spinal stenosis.  3. L2-3 moderate spinal stenosis.   Electronically Signed  By: M.D.  On: 09/16/2020 05:55  I have personally reviewed the images and agree with the above interpretation,  with the exception that I feel he has lateral recess stenosis at L4-5 potentially compressing the L5 nerve roots bilaterally.  Assessment and Plan: Mr. Haisley is a pleasant 75 y.o. male with neurogenic claudication due to L2-3 and L3-4 central stenosis and L4-5 lateral recess stenosis.  We will proceed with L2-5 minimally invasive decompression.   Marnee Spring MD, [facility] Department of Neurosurgery

## 2020-11-14 NOTE — Op Note (Addendum)
Indications: John Strong is a 75 yo male who presented with neurogenic claudication.  He failed conservative management and elected for surgical intervention.  Findings: severe stenosis  Preoperative Diagnosis: Lumbar Stenosis with neurogenic claudication Postoperative Diagnosis: same   EBL: 25 ml IVF: 600 ml Drains: none Disposition: Extubated and Stable to PACU Complications: none  No foley catheter was placed.   Preoperative Note:   Risks of surgery discussed include: infection, bleeding, stroke, coma, death, paralysis, CSF leak, nerve/spinal cord injury, numbness, tingling, weakness, complex regional pain syndrome, recurrent stenosis and/or disc herniation, vascular injury, development of instability, neck/back pain, need for further surgery, persistent symptoms, development of deformity, and the risks of anesthesia. The patient understood these risks and agreed to proceed.  Operative Note:   1. L2-5 lumbar decompression including central laminectomy and bilateral medial facetectomies including foraminotomies  The patient was then brought from the preoperative center with intravenous access established.  The patient underwent general anesthesia and endotracheal tube intubation, and was then rotated on the [place] rail top where all pressure points were appropriately padded.  The skin was then thoroughly cleansed.  Perioperative antibiotic prophylaxis was administered.  Sterile prep and drapes were then applied and a timeout was then observed.  C-arm was brought into the field under sterile conditions and under lateral visualization the L4-5 interspace was identified and marked.  The incision was marked on the left and injected with local anesthetic. Once this was complete a 6 cm incision was opened with the use of a #10 blade knife.    The metrx tubes were sequentially advanced and confirmed in position at L4-5. An 22mm by 15mm tube was locked in place to the bed side attachment.   The microscope was then sterilely brought into the field and muscle creep was hemostased with a bipolar and resected with a pituitary rongeur.  A Bovie extender was then used to expose the spinous process and lamina.  Careful attention was placed to not violate the facet capsule. A 3 mm matchstick drill bit was then used to make a hemi-laminotomy trough until the ligamentum flavum was exposed.  This was extended to the base of the spinous process and to the contralateral side to remove all the central bone from each side.  Once this was complete and the underlying ligamentum flavum was visualized, it was dissected with a curette and resected with Kerrison rongeurs.  Extensive ligamentum hypertrophy was noted, requiring a substantial amount of time and care for removal.  The dura was identified and palpated. The kerrison rongeur was then used to remove the medial facet bilaterally until no compression was noted.  A balltip probe was used to confirm decompression of the ipsilateral L5 nerve root.  Additional attention was paid to completion of the contralateral L4-5 foraminotomy until the contralateral traversing nerve root was completely free.  Once this was complete, L4-5 central decompression including medial facetectomy and foraminotomy was confirmed and decompression on both sides was confirmed. No CSF leak was noted.  A Depo-Medrol soaked Gelfoam pledget was placed in the defect.  The wound was copiously irrigated. The tube system was then removed under microscopic visualization and hemostasis was obtained with a bipolar.   After performing the decompression at L4-5, the metrx tubes were sequentially advanced and confirmed in position at L3-4. An 72mm by 74mm tube was locked in place to the bed side attachment.  Fluoroscopy was then removed from the field.  The microscope was then sterilely brought into the field and muscle  creep was hemostased with a bipolar and resected with a pituitary rongeur.  A Bovie  extender was then used to expose the spinous process and lamina.  Careful attention was placed to not violate the facet capsule. A 3 mm matchstick drill bit was then used to make a hemi-laminotomy trough until the ligamentum flavum was exposed.  This was extended to the base of the spinous process and to the contralateral side to remove all the central bone from each side.  Once this was complete and the underlying ligamentum flavum was visualized, it was dissected with a curette and resected with Kerrison rongeurs.  Extensive ligamentum hypertrophy was noted, requiring a substantial amount of time and care for removal.  The dura was identified and palpated. The kerrison rongeur was then used to remove the medial facet bilaterally until no compression was noted.  A balltip probe was used to confirm decompression of the ipsilateral L4 nerve root.  Additional attention was paid to completion of the contralateral foraminotomy until the contralateral L4 nerve root was completely free.  Once this was complete, L3-4 central decompression including medial facetectomy and foraminotomy was confirmed and decompression on both sides was confirmed. During decompression, a small area of dural defect was noted with low flow CSF leak.  This was closed with duragen and tisseel.   Please note that the CSF leak was encountered during the normal performance of the procedure, and is an inherent risk in this procedure.  The tube system was then removed under microscopic visualization and hemostasis was obtained with a bipolar.    After performing the decompression at L3-4, the metrx tubes were sequentially advanced and confirmed in position at L2-3. An 62mm by 35mm tube was locked in place to the bed side attachment.  Fluoroscopy was then removed from the field.  The microscope was then sterilely brought into the field and muscle creep was hemostased with a bipolar and resected with a pituitary rongeur.  A Bovie extender was  then used to expose the spinous process and lamina.  Careful attention was placed to not violate the facet capsule. A 3 mm matchstick drill bit was then used to make a hemi-laminotomy trough until the ligamentum flavum was exposed.  This was extended to the base of the spinous process and to the contralateral side to remove all the central bone from each side.  Once this was complete and the underlying ligamentum flavum was visualized, it was dissected with a curette and resected with Kerrison rongeurs.  Extensive ligamentum hypertrophy was noted, requiring a substantial amount of time and care for removal.  The dura was identified and palpated. The kerrison rongeur was then used to remove the medial facet bilaterally until no compression was noted.  A balltip probe was used to confirm decompression of the ipsilateral L3 nerve root.  Additional attention was paid to completion of the contralateral foraminotomy until the contralateral L3 nerve root was completely free.  Once this was complete, L2-3 central decompression including medial facetectomy and foraminotomy was confirmed and decompression on both sides was confirmed. No CSF leak was noted.  A Depo-Medrol soaked Gelfoam pledget was placed in the defect.  The wound was copiously irrigated. The tube system was then removed under microscopic visualization and hemostasis was obtained with a bipolar.     The fascial layer was reapproximated with the use of a 0 Vicryl suture.  Subcutaneous tissue layer was reapproximated using 2-0 Vicryl suture. The skin was then cleansed and 3-0 nylon was  used to close the skin opening.  Patient was then rotated back to the preoperative bed awakened from anesthesia and taken to recovery all counts are correct in this case.  I performed the entire procedure with the assistance of Anabel Halon PA as an Designer, television/film set.  Kumar Falwell K. Myer Haff MD

## 2020-11-14 NOTE — Progress Notes (Signed)
PHARMACY -  BRIEF ANTIBIOTIC NOTE   Pharmacy has received consult(s) for Cefazolin from an OR provider.  The patient's profile has been reviewed for ht/wt/allergies/indication/available labs.    One time order(s) placed for Cefazolin 2 gm based on pt wt of 108.1 kg.  Further antibiotics/pharmacy consults should be ordered by admitting physician if indicated.                       Otelia Sergeant, PharmD, [facility] 11/14/2020 6:10 AM

## 2020-11-14 NOTE — Anesthesia Procedure Notes (Signed)
Procedure Name: Intubation Date/Time: 11/14/2020 8:38 AM Performed by: Rodney Booze, CRNA Pre-anesthesia Checklist: Patient identified, Emergency Drugs available, Suction available and Patient being monitored Patient Re-evaluated:Patient Re-evaluated prior to induction Oxygen Delivery Method: Circle system utilized Preoxygenation: Pre-oxygenation with 100% oxygen Induction Type: IV induction Ventilation: Mask ventilation without difficulty Laryngoscope Size: McGraph and 4 Grade View: Grade I Tube type: Oral Tube size: 7.5 mm Number of attempts: 1 Airway Equipment and Method: Stylet and Oral airway Placement Confirmation: ETT inserted through vocal cords under direct vision,  positive ETCO2 and breath sounds checked- equal and bilateral Secured at: 21 cm Tube secured with: Tape Dental Injury: Teeth and Oropharynx as per pre-operative assessment

## 2020-11-14 NOTE — Transfer of Care (Signed)
Immediate Anesthesia Transfer of Care Note  Patient: John Strong  Procedure(s) Performed: L2-5 MINIMALLY INVASIVE DECOMPRESSION (N/A )  Patient Location: PACU  Anesthesia Type:General  Level of Consciousness: drowsy  Airway & Oxygen Therapy: Patient Spontanous Breathing and Patient connected to face mask oxygen  Post-op Assessment: Report given to RN and Post -op Vital signs reviewed and stable  Post vital signs: stable  Last Vitals:  Vitals Value Taken Time  BP 139/86 11/14/20 1201  Temp 36.2 C 11/14/20 1201  Pulse 71 11/14/20 1207  Resp 15 11/14/20 1208  SpO2 100 % 11/14/20 1207  Vitals shown include unvalidated device data.  Last Pain:  Vitals:   11/14/20 1201  TempSrc:   PainSc: 0-No pain         Complications: No complications documented.

## 2020-11-14 NOTE — Anesthesia Preprocedure Evaluation (Signed)
Anesthesia Evaluation  Patient identified by MRN, date of birth, ID band Patient awake    Reviewed: Allergy & Precautions, NPO status , Patient's Chart, lab work & pertinent test results  Airway Mallampati: III       Dental   Pulmonary former smoker,    Pulmonary exam normal        Cardiovascular hypertension, + Peripheral Vascular Disease  Normal cardiovascular exam     Neuro/Psych negative neurological ROS  negative psych ROS   GI/Hepatic Neg liver ROS, GERD  ,  Endo/Other  diabetesHypothyroidism   Renal/GU negative Renal ROS  negative genitourinary   Musculoskeletal  (+) Arthritis , Osteoarthritis,    Abdominal Normal abdominal exam  (+)   Peds negative pediatric ROS (+)  Hematology   Anesthesia Other Findings   Reproductive/Obstetrics                             Anesthesia Physical Anesthesia Plan  ASA: III  Anesthesia Plan: General   Post-op Pain Management:    Induction: Intravenous  PONV Risk Score and Plan:   Airway Management Planned: Oral ETT  Additional Equipment:   Intra-op Plan:   Post-operative Plan: Extubation in OR  Informed Consent: I have reviewed the patients History and Physical, chart, labs and discussed the procedure including the risks, benefits and alternatives for the proposed anesthesia with the patient or authorized representative who has indicated his/her understanding and acceptance.     Dental advisory given  Plan Discussed with: CRNA and Surgeon  Anesthesia Plan Comments:         Anesthesia Quick Evaluation

## 2020-11-15 ENCOUNTER — Encounter: Payer: Self-pay | Admitting: Neurosurgery

## 2020-11-15 MED ORDER — CELECOXIB 200 MG PO CAPS: 200.0000 mg | ORAL_CAPSULE | Freq: Two times a day (BID) | ORAL | 0 refills | Status: DC

## 2020-11-15 MED ORDER — METHOCARBAMOL 500 MG PO TABS: 500.0000 mg | ORAL_TABLET | Freq: Four times a day (QID) | ORAL | 0 refills | Status: DC | PRN

## 2020-11-15 MED ORDER — OXYCODONE HCL 5 MG PO TABS: 5.0000 mg | ORAL_TABLET | ORAL | 0 refills | Status: DC | PRN

## 2020-11-15 NOTE — Evaluation (Signed)
Occupational Therapy Evaluation Patient Details Name: John Strong MRN: [id number] DOB: 1945/11/15 Today's Date: 11/15/2020    History of Present Illness Patient is 75 yo male who presented with neurogenic claudication.  He failed conservative management and elected for surgical intervention. s/p L2-5 lumbar decompression including central laminectomy and bilateral medial facetectomies including foraminotomies. Past medical history of arthritis, PVD, GERD, hypertension. Patient to start mobilizing at 11:00 AM following surgery per surgery note   Clinical Impression   Pt seen for OT evaluation this date, POD#1 from above surgery. Prior to hospital admission, pt was independent in all aspects of ADL/IADL, however unable to walk more than 100 feet d/t b/l LE pain (without improvement following 2 months of PT). Pt is eager to return to PLOF with less pain and improved safety and independence. Pt was able to recall 2/3 back precautions at start of session (wife able to recall 3/3). Pt educated in functional application of back precautions, log roll technique, AE/DME for bathing/dressing/ toileting, and home/routines modifications. Pt verbalized understanding of all education/training provided. Handout provided to support recall and carry over of learned precautions/techniques. Pt demonstrated ability to adhere to 3/3 back precautions during session and was able to perform functional transfers, seated LB dressing, and standing sink-side grooming tasks MOD-I. No additional skilled OT needs identified at this time. Will sign off. Please re-consult if additional OT needs arise.      Follow Up Recommendations  No OT follow up;Supervision - Intermittent    Equipment Recommendations  None recommended by OT       Precautions / Restrictions Precautions Precautions: Fall;Back Restrictions Weight Bearing Restrictions: No      Mobility Bed Mobility Overal bed mobility: Modified Independent              General bed mobility comments: Not assessed, pt in recliner upon arrival    Transfers Overall transfer level: Needs assistance Equipment used: None Transfers: Sit to/from Stand Sit to Stand: Supervision         General transfer comment: supervision for safety; good adherance to back precautions throughout    Balance Overall balance assessment: Modified Independent Sitting-balance support: No upper extremity supported;Feet supported Sitting balance-Leahy Scale: Normal     Standing balance support: No upper extremity supported;During functional activity Standing balance-Leahy Scale: Good                             ADL either performed or assessed with clinical judgement   ADL Overall ADL's : Modified independent                                       General ADL Comments: Able to adhere to back precautions during functional transfers, seated LB dressing, and standing sink-side grooming tasks     Vision Baseline Vision/History: Wears glasses Wears Glasses: At all times              Pertinent Vitals/Pain Pain Assessment: No/denies pain Pain Score: 2  Pain Location: low back Pain Descriptors / Indicators: Discomfort Pain Intervention(s): Limited activity within patient's tolerance     Hand Dominance     Extremity/Trunk Assessment Upper Extremity Assessment Upper Extremity Assessment: Overall WFL for tasks assessed   Lower Extremity Assessment Lower Extremity Assessment: Overall WFL for tasks assessed       Communication Communication Communication: No difficulties   Cognition Arousal/Alertness: Awake/alert  Behavior During Therapy: WFL for tasks assessed/performed Overall Cognitive Status: Within Functional Limits for tasks assessed                                        Exercises Other Exercises Other Exercises: Pt educated in functional application of back precautions, log roll technique, AE/DME for  bathing/dressing/ toileting, and home/routines modifications. Pt verbalized understanding of all education/training provided. Handout provided to support recall and carry over of learned precautions/techniques.        Home Living Family/patient expects to be discharged to:: Private residence Living Arrangements: Spouse/significant other Available Help at Discharge: Family;Available 24 hours/day Type of Home: House Home Access: Stairs to enter [employer] of Steps: 2         Bathroom Shower/Tub: Chief Strategy Officer: Handicapped height     Home Equipment: Cane - single point;Other (comment)   Additional Comments: Reacher      Prior Functioning/Environment Level of Independence: Independent        Comments: Pt drives and enjoys doing yard work. Pt is retired (used to work for [employer])        OT Problem List: Impaired balance (sitting and/or standing);Decreased knowledge of precautions      OT Treatment/Interventions: Self-care/ADL training;Therapeutic activities;Balance training    OT Goals(Current goals can be found in the care plan section) Acute Rehab OT Goals Patient Stated Goal: to go home OT Goal Formulation: With patient/family Time For Goal Achievement: 11/29/20 Potential to Achieve Goals: Good  OT Frequency:      AM-PAC OT "6 Clicks" Daily Activity     Outcome Measure Help from another person eating meals?: None Help from another person taking care of personal grooming?: None Help from another person toileting, which includes using toliet, bedpan, or urinal?: A Little Help from another person bathing (including washing, rinsing, drying)?: A Little Help from another person to put on and taking off regular upper body clothing?: None Help from another person to put on and taking off regular lower body clothing?: A Little 6 Click Score: 21   End of Session Nurse Communication: Mobility status  Activity Tolerance: Patient tolerated  treatment well Patient left: in chair;with call bell/phone within reach  OT Visit Diagnosis: Unsteadiness on feet (R26.81)                Time: 1130-1146 OT Time Calculation (min): 16 min Charges:  OT General Charges $OT Visit: 1 Visit OT Evaluation $OT Eval Moderate Complexity: 1 Mod  Matthew Folks, OTR/L [facility] [phone]

## 2020-11-15 NOTE — Evaluation (Addendum)
Physical Therapy Evaluation Patient Details Name: John Strong MRN: [id number] DOB: 10/12/1945 Today's Date: 11/15/2020   History of Present Illness  Patient is 75 yo male who presented with neurogenic claudication.  He failed conservative management and elected for surgical intervention. s/p L2-5 lumbar decompression including central laminectomy and bilateral medial facetectomies including foraminotomies. Past medical history of arthritis, PVD, GERD, hypertension. Patient to start mobilizing at 11:00 AM following surgery per surgery note.    Clinical Impression  Patient eager to mobilize and agreeable to PT evaluation. Patient is demonstrating high level of functional mobility post-op. Patient reports 2/10 low back pain at rest that does not worsen with activity. Patient was able to maintain general spinal precautions without difficulty with initial verbal cueing with functional mobility. Patient is ambulating in hallway without assistive device. Do not anticipate PT needs at discharge and no DME recommended. PT will follow up tomorrow if patient is still admitted, although patient could be discharge today from a mobility stand point.     Follow Up Recommendations No PT follow up    Equipment Recommendations  None recommended by PT    Recommendations for Other Services       Precautions / Restrictions Precautions Precautions: Fall;Back Restrictions Weight Bearing Restrictions: No      Mobility  Bed Mobility Overal bed mobility: Modified Independent             General bed mobility comments: with initial verbal cues, patient demonstrated correct logroll technique with head of bed flat and no use of bed rails. no reported increased pain with activity    Transfers Overall transfer level: Needs assistance Equipment used: None Transfers: Sit to/from Stand Sit to Stand: Supervision         General transfer comment: supervision for safety  Ambulation/Gait Ambulation/Gait  assistance: Supervision Gait Distance (Feet): 200 Feet Assistive device: None Gait Pattern/deviations: Step-through pattern     General Gait Details: no loss of balance with ambulation. no reported increased pain with activity  Stairs            Wheelchair Mobility    Modified Rankin (Stroke Patients Only)       Balance Overall balance assessment: Needs assistance Sitting-balance support: Feet supported Sitting balance-Leahy Scale: Good     Standing balance support: No upper extremity supported;During functional activity Standing balance-Leahy Scale: Good                               Pertinent Vitals/Pain Pain Assessment: 0-10 Pain Score: 2  Pain Location: low back Pain Descriptors / Indicators: Discomfort Pain Intervention(s): Limited activity within patient's tolerance    Home Living Family/patient expects to be discharged to:: Private residence Living Arrangements: Spouse/significant other Available Help at Discharge: Family;Available 24 hours/day Type of Home: House Home Access: Stairs to enter   [employer] of Steps: 2   Home Equipment: Gilmer Mor - single point      Prior Function Level of Independence: Independent               Hand Dominance        Extremity/Trunk Assessment   Upper Extremity Assessment Upper Extremity Assessment: Overall WFL for tasks assessed    Lower Extremity Assessment Lower Extremity Assessment: Overall WFL for tasks assessed       Communication   Communication: No difficulties  Cognition Arousal/Alertness: Awake/alert Behavior During Therapy: WFL for tasks assessed/performed Overall Cognitive Status: Within Functional Limits for tasks assessed  General Comments      Exercises     Assessment/Plan    PT Assessment Patient needs continued PT services  PT Problem List Decreased mobility;Pain;Decreased safety  awareness;Decreased knowledge of precautions       PT Treatment Interventions DME instruction;Gait training;Stair training;Functional mobility training;Therapeutic activities;Therapeutic exercise;Balance training;Patient/family education    PT Goals (Current goals can be found in the Care Plan section)  Acute Rehab PT Goals Patient Stated Goal: to go home this afternoon PT Goal Formulation: With patient Time For Goal Achievement: 11/29/20 Potential to Achieve Goals: Good    Frequency 7X/week   Barriers to discharge        Co-evaluation               AM-PAC PT "6 Clicks" Mobility  Outcome Measure Help needed turning from your back to your side while in a flat bed without using bedrails?: None Help needed moving from lying on your back to sitting on the side of a flat bed without using bedrails?: A Little Help needed moving to and from a bed to a chair (including a wheelchair)?: A Little Help needed standing up from a chair using your arms (e.g., wheelchair or bedside chair)?: A Little Help needed to walk in hospital room?: A Little Help needed climbing 3-5 steps with a railing? : A Little 6 Click Score: 19    End of Session Equipment Utilized During Treatment: Gait belt Activity Tolerance: Patient tolerated treatment well Patient left: in chair;with call bell/phone within reach;with family/visitor present Nurse Communication: Mobility status PT Visit Diagnosis: Other abnormalities of gait and mobility (R26.89)    Time: 0300-9233 PT Time Calculation (min) (ACUTE ONLY): 25 min   Charges:   PT Evaluation $PT Eval Low Complexity: 1 Low PT Treatments $Therapeutic Activity: 8-22 mins        Donna Bernard, PT, MPT   Ina Homes 11/15/2020, 12:50 PM

## 2020-11-15 NOTE — Clinical Social Work Note (Signed)
CSW acknowledges home health/DME consult. PT and OT evaluated today and determined patient has no needs. Patient has orders to discharge home today. UR confirmed he does not need a Code 44. RN aware. No further concerns. CSW signing off.  Charlynn Court, CSW [phone]

## 2020-11-15 NOTE — Plan of Care (Signed)
  Problem: Health Behavior/Discharge Planning: Goal: Ability to manage health-related needs will improve Outcome: Progressing   Problem: Clinical Measurements: Goal: Ability to maintain clinical measurements within normal limits will improve Outcome: Progressing Goal: Will remain free from infection Outcome: Progressing Goal: Respiratory complications will improve Outcome: Progressing   Problem: Activity: Goal: Risk for activity intolerance will decrease Outcome: Progressing   Problem: Education: Goal: Ability to verbalize activity precautions or restrictions will improve Outcome: Progressing Goal: Knowledge of the prescribed therapeutic regimen will improve Outcome: Progressing Goal: Understanding of discharge needs will improve Outcome: Progressing   Problem: Activity: Goal: Ability to avoid complications of mobility impairment will improve Outcome: Progressing Goal: Will remain free from falls Outcome: Progressing   Problem: Bowel/Gastric: Goal: Gastrointestinal status for postoperative course will improve Outcome: Progressing

## 2020-11-15 NOTE — Progress Notes (Signed)
Pt provided discharge instructions and all medications have been sent to pharmacy of choice, this was confirmed with provider. All pts belonging were returned and all questions and concerns answered at this time. Surgical dressing on pts back was changed per MD verbal order and pt is ready to go home. Pt's mode of transport at discharge was wheelchair accompanied by his wife.  VSS and as follows:   11/15/20 1159  Vitals  Temp 98.7 F (37.1 C)  BP 125/79  MAP (mmHg) 92  BP Location Left Arm  BP Method Automatic  Patient Position (if appropriate) Sitting  Pulse Rate 69  Resp 18  MEWS COLOR  MEWS Score Color Green  Oxygen Therapy  SpO2 97 %  O2 Device Room [facility]

## 2020-11-15 NOTE — Discharge Summary (Signed)
Physician Discharge Summary  Patient ID: John Strong MRN: [id number] DOB/AGE: 08-27-1945 75 y.o.  Admit date: 11/14/2020 Discharge date: 11/15/2020  Admission Diagnoses: neurogenic claudication due to spinal stenosis  Discharge Diagnoses:  Active Problems:   Neurogenic claudication due to lumbar spinal stenosis   Discharged Condition: good  Hospital Course:  Mr. Grieder was admitted for neurogenic claudication.  He tolerated surgery well, and was ready for discharge on POD1.  Consults: None  Significant Diagnostic Studies: none  Treatments: surgery: L2-5 decompression  Discharge Exam: Blood pressure 125/79, pulse 69, temperature 98.7 F (37.1 C), resp. rate 18, height 6\' 2"  (1.88 m), weight 109.8 kg, SpO2 97 %. General appearance: alert and cooperative  CNI Doing well  Disposition: Discharge disposition: 01-Home or Self Care       Discharge Instructions    Incentive spirometry RT   Complete by: As directed      Allergies as of 11/15/2020      Reactions   Lisinopril    Other reaction(s): Angioedema      Medication List    TAKE these medications   celecoxib 200 MG capsule Commonly known as: CELEBREX Take 1 capsule (200 mg total) by mouth every 12 (twelve) hours.   cyanocobalamin 1000 MCG tablet Take 1,000 mcg by mouth daily.   DSS 100 MG Caps Take 50 mg by mouth 2 (two) times daily.   gabapentin 300 MG capsule Commonly known as: NEURONTIN Take 300-600 mg by mouth See admin instructions. Tale 300 mg in the morning and 600 mg at bedtime   hydrochlorothiazide 12.5 MG tablet Commonly known as: HYDRODIURIL Take 12.5 mg by mouth daily.   levothyroxine 112 MCG tablet Commonly known as: SYNTHROID Take 112 mcg by mouth daily.   metFORMIN 500 MG tablet Commonly known as: GLUCOPHAGE Take 500 mg by mouth 2 (two) times daily.   methocarbamol 500 MG tablet Commonly known as: ROBAXIN Take 1 tablet (500 mg total) by mouth every 6 (six) hours as needed for  muscle spasms.   NIFEdipine 30 MG 24 hr tablet Commonly known as: ADALAT CC Take 30 mg by mouth 2 (two) times daily.   omeprazole 20 MG capsule Commonly known as: PRILOSEC Take 20 mg by mouth daily.   oxyCODONE 5 MG immediate release tablet Commonly known as: Oxy IR/ROXICODONE Take 1 tablet (5 mg total) by mouth every 3 (three) hours as needed for moderate pain ((score 4 to 6)).   rosuvastatin 10 MG tablet Commonly known as: CRESTOR Take 10 mg by mouth at bedtime.       Follow-up Information    11/17/2020, MD Follow up in 2 week(s).   Specialty: Neurosurgery Contact information: [address] [place] [phone]              Scheduled for April 7 at 1130 - at [facility].  [facility] 11/15/2020, 1:29 PM

## 2020-11-15 NOTE — Discharge Instructions (Signed)
Your surgeon has performed an operation on your lumbar spine (low back) to relieve pressure on one or more nerves. Many times, patients feel better immediately after surgery and can "overdo it." Even if you feel well, it is important that you follow these activity guidelines. If you do not let your back heal properly from the surgery, you can increase the chance of a disc herniation and/or return of your symptoms. The following are instructions to help in your recovery once you have been discharged from the hospital.  * Ok to take anti-inflammatory medications after surgery (naproxen [Aleve], ibuprofen [Advil, Motrin], celecoxib [Celebrex], etc.)  Activity    No bending, lifting, or twisting ("BLT"). Avoid lifting objects heavier than 10 pounds (gallon milk jug).  Where possible, avoid household activities that involve lifting, bending, pushing, or pulling such as laundry, vacuuming, grocery shopping, and childcare. Try to arrange for help from friends and family for these activities while your back heals.  Increase physical activity slowly as tolerated.  Taking short walks is encouraged, but avoid strenuous exercise. Do not jog, run, bicycle, lift weights, or participate in any other exercises unless specifically allowed by your doctor. Avoid prolonged sitting, including car rides.  Talk to your doctor before resuming sexual activity.  You should not drive until cleared by your doctor.  Until released by your doctor, you should not return to work or school.  You should rest at home and let your body heal.   You may shower three days after your surgery.  After showering, lightly dab your incision dry. Do not take a tub bath or go swimming for 3 weeks, or until approved by your doctor at your follow-up appointment.  If you smoke, we strongly recommend that you quit.  Smoking has been proven to interfere with normal healing in your back and will dramatically reduce the success rate of your surgery.  Please contact [facility] (800-QUIT-NOW) and use the resources at www.[facility].com for assistance in stopping smoking.  Surgical Incision   If you have a dressing on your incision, you may remove it three days after your surgery. Keep your incision area clean and dry.  If you have staples or stitches on your incision, you should have a follow up scheduled for removal. If you do not have staples or stitches, you will have steri-strips (small pieces of surgical tape) or Dermabond glue. The steri-strips/glue should begin to peel away within about a week (it is fine if the steri-strips fall off before then). If the strips are still in place one week after your surgery, you may gently remove them.  Diet            You may return to your usual diet. Be sure to stay hydrated.  When to Contact us  Although your surgery and recovery will likely be uneventful, you may have some residual numbness, aches, and pains in your back and/or legs. This is normal and should improve in the next few weeks.  However, should you experience any of the following, contact us immediately: . New numbness or weakness . Pain that is progressively getting worse, and is not relieved by your pain medications or rest . Bleeding, redness, swelling, pain, or drainage from surgical incision . Chills or flu-like symptoms . Fever greater than 101.0 F (38.3 C) . Problems with bowel or bladder functions . Difficulty breathing or shortness of breath . Warmth, tenderness, or swelling in your calf  Contact Information . During office hours (Monday-Friday 9 am to  5 pm), please call your physician at [phone] . After hours and weekends, please call [phone] and speak with the answering service, who will contact the doctor on call.  If that fails, call the [facility] Operator at [phone] and ask for the Neurosurgery Resident On Call  . For a life-threatening emergency, call 911

## 2020-11-15 NOTE — Progress Notes (Signed)
    Attending Progress Note  History: John Strong is here for neurogenic claudication.    POD1: Doing well. Expected back pain.  On bedrest until 1100.  Physical Exam: Vitals:   11/14/20 1920 11/15/20 0359  BP: 140/80 124/76  Pulse: 86 67  Resp: 18 17  Temp: 98.3 F (36.8 C) 98.6 F (37 C)  SpO2: 97% 97%    AA Ox3 CNI  Strength:5/5 throughout BLE  Data:  No results for input(s): NA, K, CL, CO2, BUN, CREATININE, LABGLOM, GLUCOSE, CALCIUM in the last 168 hours. No results for input(s): AST, ALT, ALKPHOS in the last 168 hours.  Invalid input(s): TBILI   No results for input(s): WBC, HGB, HCT, PLT in the last 168 hours. No results for input(s): APTT, INR in the last 168 hours.       Other tests/results: none  Assessment/Plan:  John Strong is doing well after L2-5 decompression.  - mobilize at 11 am - pain control - DVT prophylaxis - PTOT eval if needed   Venetia Night MD, [facility] Department of Neurosurgery

## 2020-11-19 NOTE — Anesthesia Postprocedure Evaluation (Signed)
Anesthesia Post Note  Patient: John Strong  Procedure(s) Performed: L2-5 MINIMALLY INVASIVE DECOMPRESSION (N/A )  Patient location during evaluation: PACU Anesthesia Type: General Level of consciousness: awake and alert and oriented Pain management: pain level controlled Vital Signs Assessment: post-procedure vital signs reviewed and stable Respiratory status: spontaneous breathing Cardiovascular status: blood pressure returned to baseline Anesthetic complications: no   No complications documented.   Last Vitals:  Vitals:   11/15/20 1009 11/15/20 1159  BP: 125/79 125/79  Pulse: 69 69  Resp: 18 18  Temp: 36.8 C 37.1 C  SpO2: 99% 97%    Last Pain:  Vitals:   11/15/20 0740  TempSrc:   PainSc: 0-No pain                 Jalecia Leon

## 2021-10-18 IMAGING — MR MR LUMBAR SPINE W/O CM
5 series · 31 of 48 positions shown · non-contrast
Comparison: None.

CLINICAL DATA: Low back pain with bilateral leg pain and weakness

EXAM:
MRI LUMBAR SPINE WITHOUT CONTRAST
TECHNIQUE: Multiplanar, multisequence MR imaging of the lumbar spine was
performed. No intravenous contrast was administered.

[Series 5: T2 · sagittal · 4.0mm · 0.81mm/px · 6 of 17 slices shown (1 of 2)]
[im 1/17]
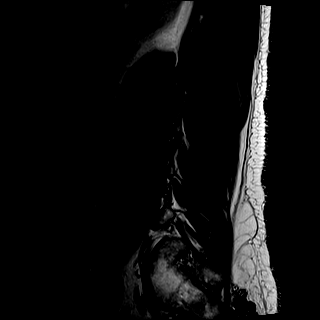
[im 4/17]
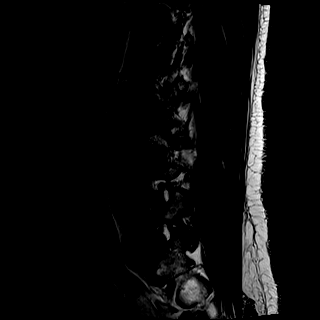
[im 7/17]
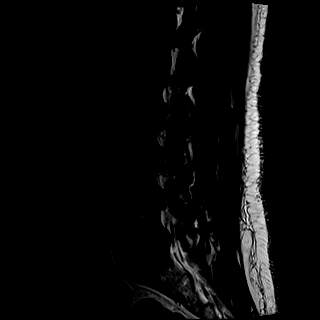
[im 10/17]
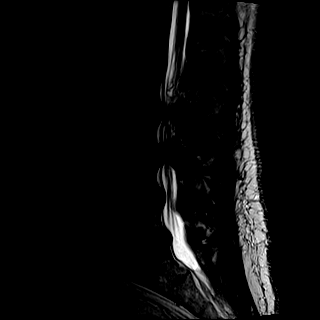
[im 13/17]
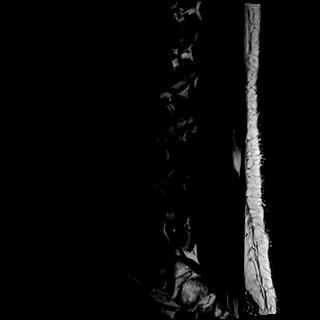
[im 17/17]
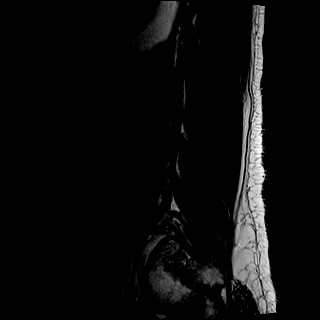

[Series 6: T1 · sagittal · 4.0mm · 0.81mm/px · 6 of 17 slices shown (1 of 2)]
[im 1/17]
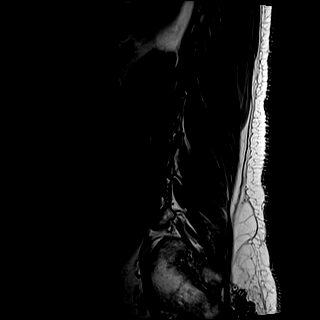
[im 4/17]
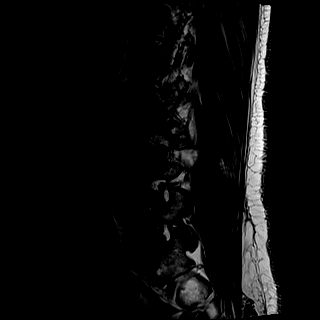
[im 7/17]
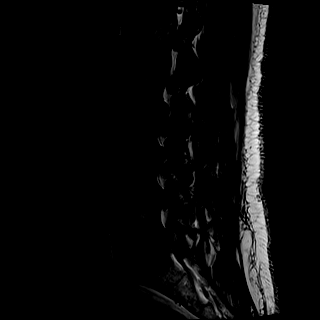
[im 10/17]
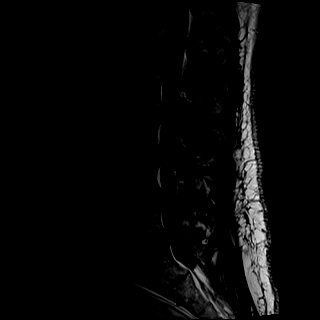
[im 13/17]
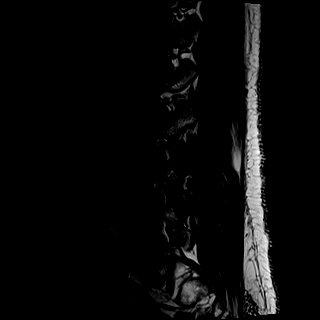
[im 17/17]
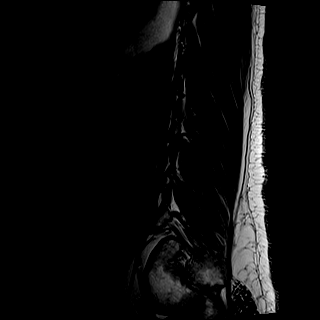

[Series 7: STIR · sagittal · 4.0mm · 0.41mm/px · 1 of 17 slices shown]
[im 1/17]
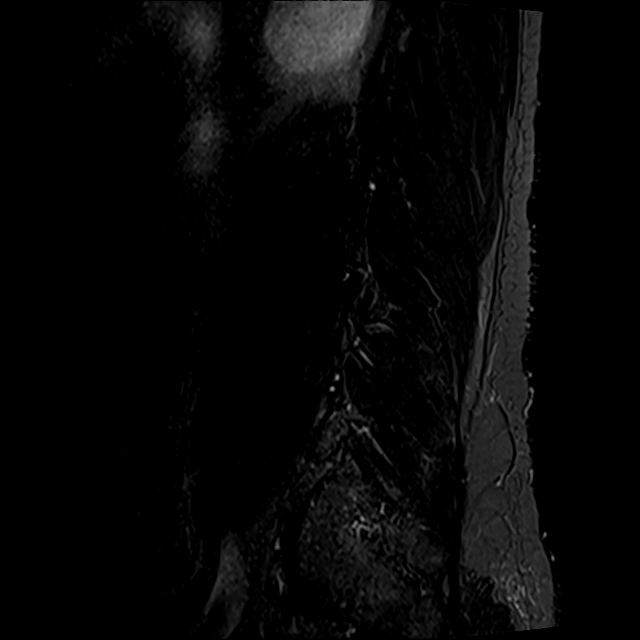

[Series 8: T2 · axial · 4.0mm · 0.78mm/px · z∈[-109,+114]mm · 9 of 39 slices shown (2 of 2)]
[im 1/39]
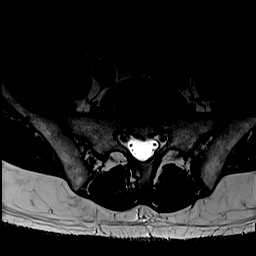
[im 6/39]
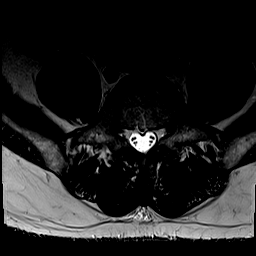
[im 11/39]
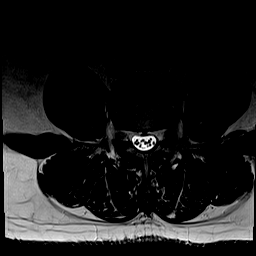
[im 17/39]
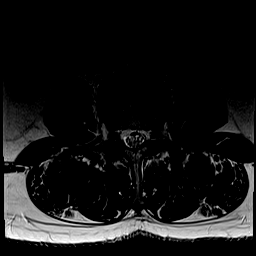
[im 20/39]
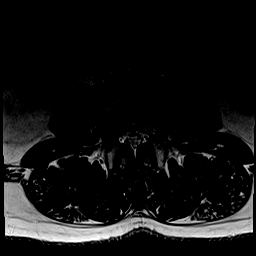
[im 22/39]
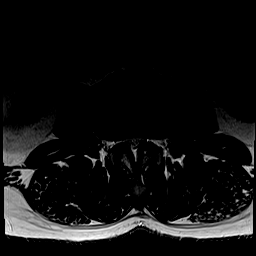
[im 28/39]
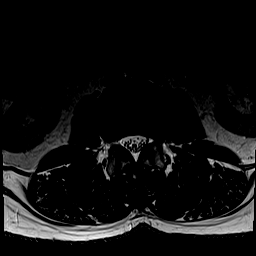
[im 33/39]
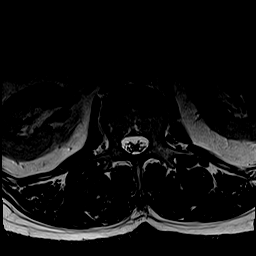
[im 39/39]
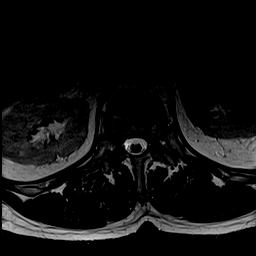

[Series 9: T1 · axial · 4.0mm · 0.39mm/px · z∈[-109,+114]mm · 9 of 39 slices shown (2 of 2)]
[im 1/39]
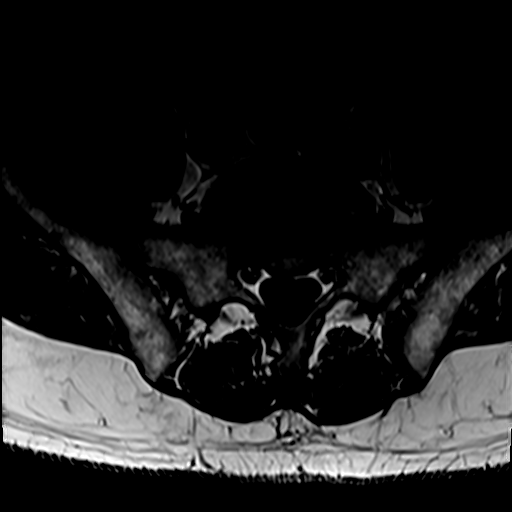
[im 6/39]
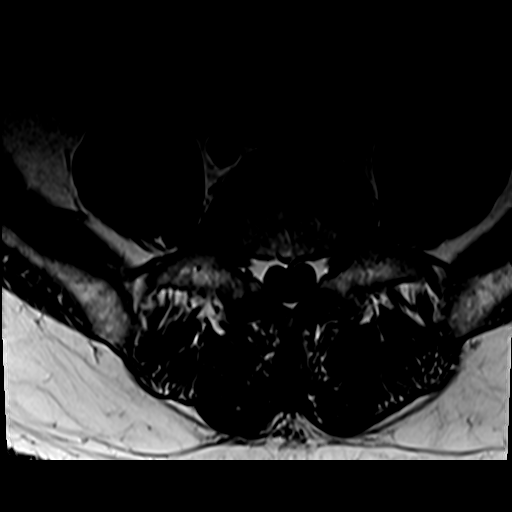
[im 11/39]
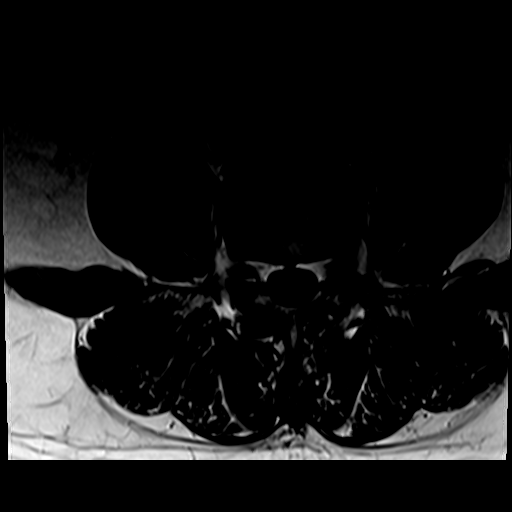
[im 17/39]
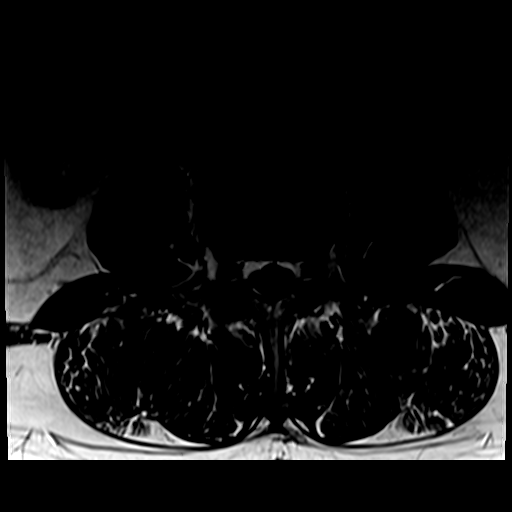
[im 20/39]
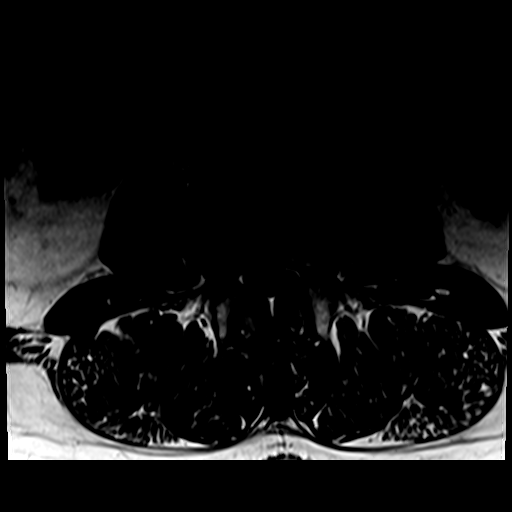
[im 22/39]
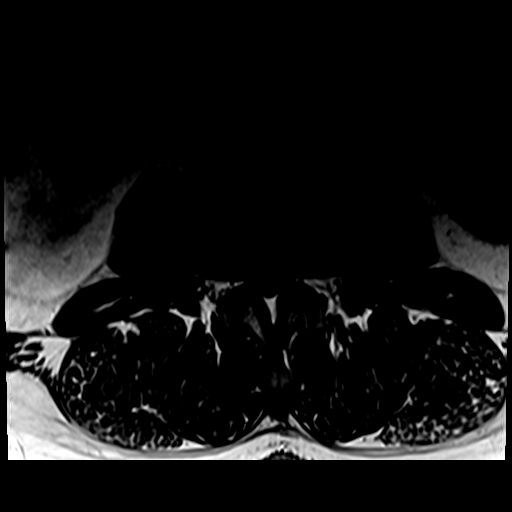
[im 28/39]
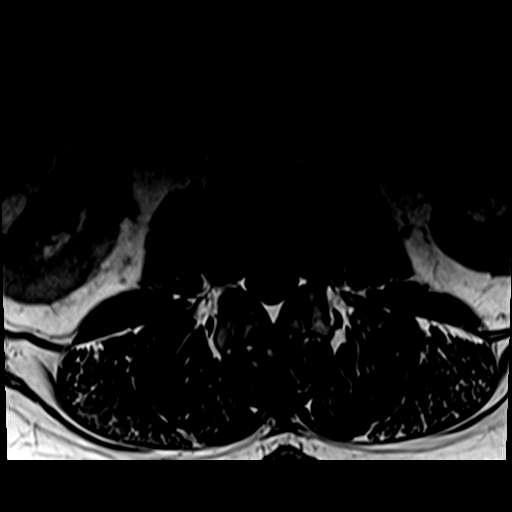
[im 33/39]
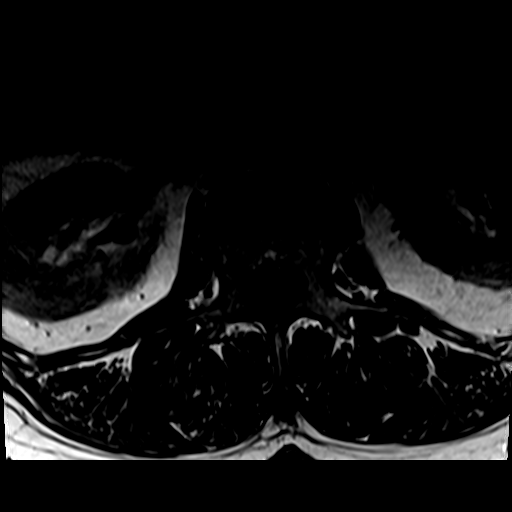
[im 39/39]
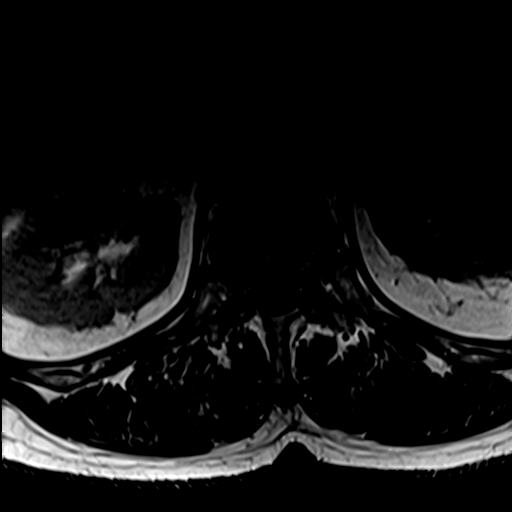

[31 of 48 positions shown; findings below may reference images not displayed]

FINDINGS: Segmentation:  5 lumbar type vertebrae

Alignment:  Slight anterolisthesis at L3-4, degenerative

Vertebrae:  No fracture, evidence of discitis, or bone lesion.

Conus medullaris and cauda equina: Conus extends to the L1-2 level.
Conus appears normal. There is cauda equina redundancy due to the
degree of spinal stenosis.

Paraspinal and other soft tissues: Negative

Disc levels:

Congenitally narrow spinal canal from short pedicles.

T12- L1: Unremarkable.

L1-L2: Unremarkable.

L2-L3: Disc narrowing and bulging with endplate spurring. Facet
spurring and ligamentum flavum thickening. Moderate thecal sac
narrowing. Patent foramina.

L3-L4: Disc narrowing and bulging. Facet osteoarthritis with
spurring and ligamentum flavum thickening. Mild anterolisthesis.
High-grade spinal stenosis. Mild bilateral foraminal narrowing.

L4-L5: Disc narrowing and bulging. Mild facet spurring. Patent canal
and foramina.

L5-S1:Mild disc bulging.  Negative facets.  No neural compression.
IMPRESSION: 1. Multilevel degenerative disease exacerbated by short pedicles and
L3-4 anterolisthesis.
2. L3-4 severe spinal stenosis.
3. L2-3 moderate spinal stenosis.

## 2021-10-31 ENCOUNTER — Encounter: Payer: Self-pay | Admitting: Urology

## 2021-10-31 ENCOUNTER — Ambulatory Visit (INDEPENDENT_AMBULATORY_CARE_PROVIDER_SITE_OTHER): Payer: Medicare Other | Admitting: Urology

## 2021-10-31 ENCOUNTER — Other Ambulatory Visit: Payer: Self-pay

## 2021-10-31 VITALS — BP 151/82 | HR 82 | Ht 73.0 in | Wt 234.0 lb

## 2021-10-31 DIAGNOSIS — R972 Elevated prostate specific antigen [PSA]: Secondary | ICD-10-CM

## 2021-10-31 DIAGNOSIS — Z125 Encounter for screening for malignant neoplasm of prostate: Secondary | ICD-10-CM

## 2021-10-31 DIAGNOSIS — N529 Male erectile dysfunction, unspecified: Secondary | ICD-10-CM

## 2021-10-31 MED ORDER — TADALAFIL 10 MG PO TABS: 10.0000 mg | ORAL_TABLET | Freq: Every day | ORAL | 11 refills | Status: DC | PRN

## 2021-10-31 NOTE — Patient Instructions (Signed)
Prostate Cancer Screening ?Prostate cancer screening is testing that is done to check for the presence of prostate cancer in men. The prostate gland is a walnut-sized gland that is located below the bladder and in front of the rectum in males. The function of the prostate is to add fluid to semen during ejaculation. Prostate cancer is one of the most common types of cancer in men. ?Who should have prostate cancer screening? ?Screening recommendations vary based on age and other risk factors, as well as between the professional organizations who make the recommendations. ?In general, screening is recommended if: ?You are age 50 to 70 and have an average risk for prostate cancer. You should talk with your health care provider about your need for screening and how often screening should be done. Because most prostate cancers are slow growing and will not cause death, screening in this age group is generally reserved for men who have a 10- to 15-year life expectancy. ?You are younger than age 50, and you have these risk factors: ?Having a father, brother, or uncle who has been diagnosed with prostate cancer. The risk is higher if your family member's cancer occurred at an early age or if you have multiple family members with prostate cancer at an early age. ?Being a male who is Black or is of Caribbean or sub-Saharan African descent. ?In general, screening is not recommended if: ?You are younger than age 40. ?You are between the ages of 40 and 49 and you have no risk factors. ?You are 70 years of age or older. At this age, the risks that screening can cause are greater than the benefits that it may provide. ?If you are at high risk for prostate cancer, your health care provider may recommend that you have screenings more often or that you start screening at a younger age. ?How is screening for prostate cancer done? ?The recommended prostate cancer screening test is a blood test called the prostate-specific antigen (PSA)  test. PSA is a protein that is made in the prostate. As you age, your prostate naturally produces more PSA. Abnormally high PSA levels may be caused by: ?Prostate cancer. ?An enlarged prostate that is not caused by cancer (benign prostatic hyperplasia, or BPH). This condition is very common in older men. ?A prostate gland infection (prostatitis) or urinary tract infection. ?Certain medicines such as male hormones (like testosterone) or other medicines that raise testosterone levels. ?A rectal exam may be done as part of prostate cancer screening to help provide information about the size of your prostate gland. When a rectal exam is performed, it should be done after the PSA level is drawn to avoid any effect on the results. ?Depending on the PSA results, you may need more tests, such as: ?A physical exam to check the size of your prostate gland, if not done as part of screening. ?Blood and imaging tests. ?A procedure to remove tissue samples from your prostate gland for testing (biopsy). This is the only way to know for certain if you have prostate cancer. ?What are the benefits of prostate cancer screening? ?Screening can help to identify cancer at an early stage, before symptoms start and when the cancer can be treated more easily. ?There is a small chance that screening may lower your risk of dying from prostate cancer. The chance is small because prostate cancer is a slow-growing cancer, and most men with prostate cancer die from a different cause. ?What are the risks of prostate cancer screening? ?The main   risk of prostate cancer screening is diagnosing and treating prostate cancer that would never have caused any symptoms or problems. This is called overdiagnosisand overtreatment. PSA screening cannot tell you if your PSA is high due to cancer or a different cause. A prostate biopsy is the only procedure to diagnose prostate cancer. Even the results of a biopsy may not tell you if your cancer needs to be  treated. Slow-growing prostate cancer may not need any treatment other than monitoring, so diagnosing and treating it may cause unnecessary stress or other side effects. ?Questions to ask your health care provider ?When should I start prostate cancer screening? ?What is my risk for prostate cancer? ?How often do I need screening? ?What type of screening tests do I need? ?How do I get my test results? ?What do my results mean? ?Do I need treatment? ?Where to find more information ?The American Cancer Society: www.cancer.org ?American Urological Association: www.auanet.org ?Contact a health care provider if: ?You have difficulty urinating. ?You have pain when you urinate or ejaculate. ?You have blood in your urine or semen. ?You have pain in your back or in the area of your prostate. ?Summary ?Prostate cancer is a common type of cancer in men. The prostate gland is located below the bladder and in front of the rectum. This gland adds fluid to semen during ejaculation. ?Prostate cancer screening may identify cancer at an early stage, when the cancer can be treated more easily and is less likely to have spread to other areas of the body. ?The prostate-specific antigen (PSA) test is the recommended screening test for prostate cancer, but it has associated risks. ?Discuss the risks and benefits of prostate cancer screening with your health care provider. If you are age 70 or older, the risks that screening can cause are greater than the benefits that it may provide. ?This information is not intended to replace advice given to you by your health care provider. Make sure you discuss any questions you have with your health care provider. ?Document Revised: 02/04/2021 Document Reviewed: 02/04/2021 ?Elsevier Patient Education ? 2022 Elsevier Inc. ? ?

## 2021-10-31 NOTE — Progress Notes (Signed)
? ?10/31/21 ?11:28 AM  ? ?John Strong ?29-Nov-1945 ?[id number] ? ?CC: PSA screening, ED ? ?HPI: ?76 year old male with reported family history of lethal prostate cancer in his father who was referred for a PSA of 5.21, as well as erectile dysfunction.  His PSA has increased slowly over the last few years, including 4.97 in October 2022 and 3.57 in January 2019.  He has never had a prior prostate biopsy.  He he denies any significant urinary symptoms except for some mild frequency during the day and nocturia 1-2 times overnight. ? ?In terms of ED, he previously was on sildenafil with good results but got bothersome headaches and discontinued that medication.  He is interested in other options for erections. ? ? ?PMH: ?Past Medical History:  ?Diagnosis Date  ? Arthritis   ? Diabetes mellitus without complication (HCC)   ? GERD (gastroesophageal reflux disease)   ? Hyperlipidemia   ? Hypertension   ? Thyroid disease   ? ? ?Surgical History: ?Past Surgical History:  ?Procedure Laterality Date  ? LUMBAR LAMINECTOMY/DECOMPRESSION MICRODISCECTOMY N/A 11/14/2020  ? Procedure: L2-5 MINIMALLY INVASIVE DECOMPRESSION;  Surgeon: Venetia Night, MD;  Location: [facility] ORS;  Service: Neurosurgery;  Laterality: N/A;  ? THYROIDECTOMY    ? ? ? ?Family History: ?Family History  ?Problem Relation Age of Onset  ? Diabetes Mother   ? Colon cancer Father   ? ? ?Social History:  reports that he has quit smoking. His smoking use included cigarettes. He has never used smokeless tobacco. He reports that he does not currently use alcohol. He reports current drug use. Drugs: Cocaine and Marijuana. ? ?Physical Exam: ?BP (!) 151/82 (BP Location: Left Arm, Patient Position: Sitting, Cuff Size: Large)   Pulse 82   Ht 6\' 1"  (1.854 m)   Wt 234 lb (106.1 kg)   BMI 30.87 kg/m?   ? ?Constitutional:  Alert and oriented, No acute distress. ?Cardiovascular: No clubbing, cyanosis, or edema. ?Respiratory: Normal respiratory effort, no increased work of  breathing. ?GI: Abdomen is soft, nontender, nondistended, no abdominal masses ?DRE: 50 g, smooth, no nodules or masses ? ?Laboratory Data: ?Reviewed, see HPI ? ?Assessment & Plan:   ?76 year old male with reported family history of lethal prostate cancer in his father who was recently found to have a PSA of 5.2 which is increased slightly over the last 4 years.  We reviewed the AUA guidelines, and that this PSA is actually normal for his age at less than 6.5. ? ?We reviewed the implications of PSA screening and the uncertainty surrounding it. In general, a man's PSA increases with age and is produced by both normal and cancerous prostate tissue. The differential diagnosis for elevated PSA includes BPH, prostate cancer, infection, recent intercourse/ejaculation, recent urethroscopic manipulation (foley placement/cystoscopy) or trauma, and prostatitis.  ? ?Management of an elevated PSA can include observation or prostate biopsy and we discussed this in detail. Our goal is to detect clinically significant prostate cancers, and manage with either active surveillance, surgery, or radiation for localized disease. Risks of prostate biopsy include bleeding, infection (including life threatening sepsis), pain, and lower urinary symptoms. Hematuria, hematospermia, and blood in the stool are all common after biopsy and can persist up to 4 weeks.  ? ?We discussed options for his PSA including prostate biopsy, prostate MRI, or repeat PSA with reflex to free in 6 months.  Using shared decision making he would like to repeat the PSA in 6 months which is very reasonable.  Risk and benefits discussed. ? ?  Regarding ED, he is interested in a trial of Cialis 10 to 20 mg on demand ?RTC 6 months with PSA reflex to free prior ? ? ?Legrand Rams, MD ?10/31/2021 ? ?[facility] ?[address], [address] ?[place], [place] [address] ?([phone] ? ? ?

## 2021-12-17 IMAGING — RF DG C-ARM 1-60 MIN
1 series · 6 of 6 positions shown · non-contrast
Comparison: MRI lumbar spine 09/15/2020

CLINICAL DATA: Lumbar spine surgery

EXAM:
OPERATIVE LUMBAR SPINE 1 VIEW(S)
Fluoroscopy time: 8 seconds

[Series 1: dg x-ray · 0.20mm/px · 6 of 6 slices shown]
[im 1/6]
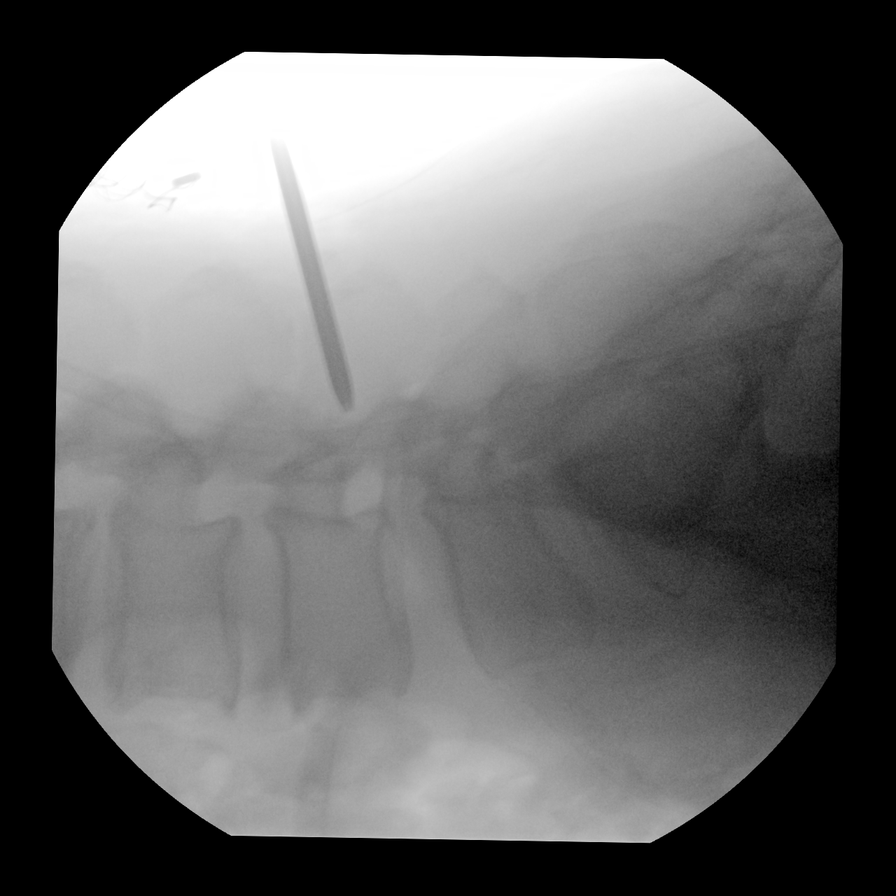
[im 2/6]
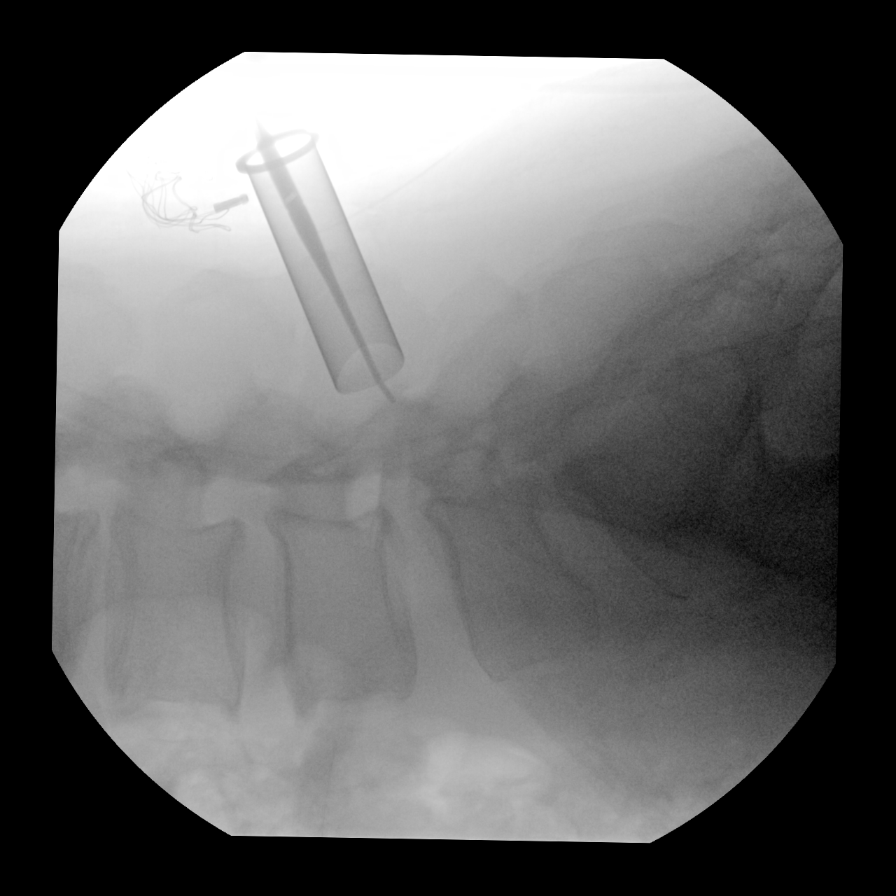
[im 3/6]
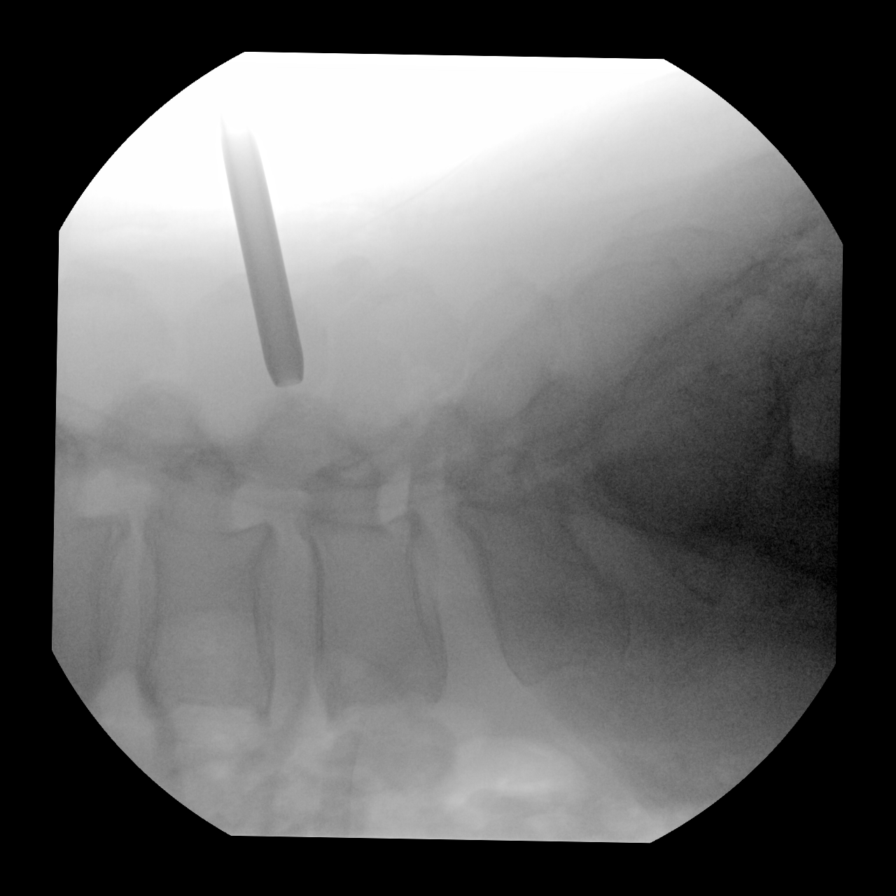
[im 4/6]
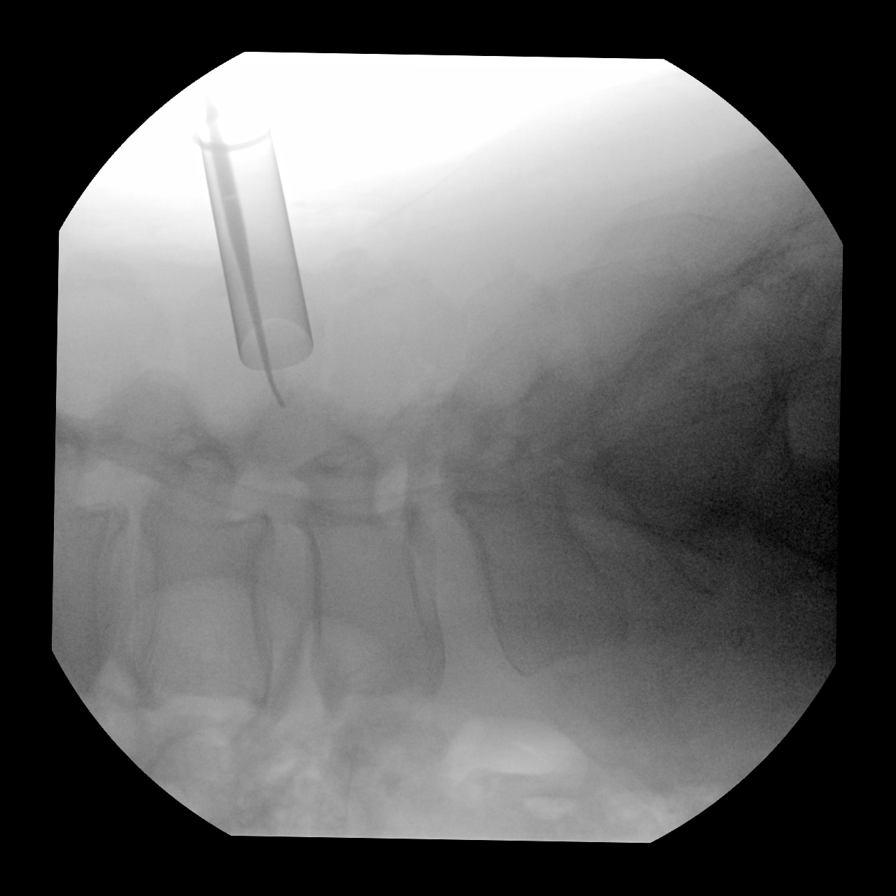
[im 5/6]
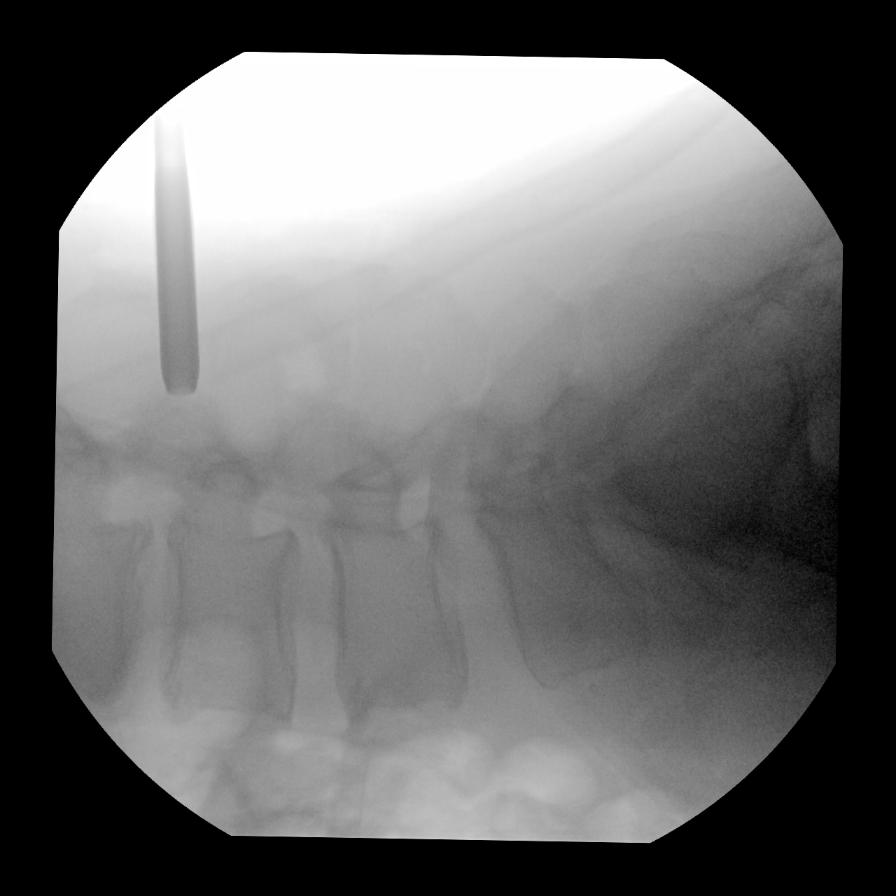
[im 6/6]
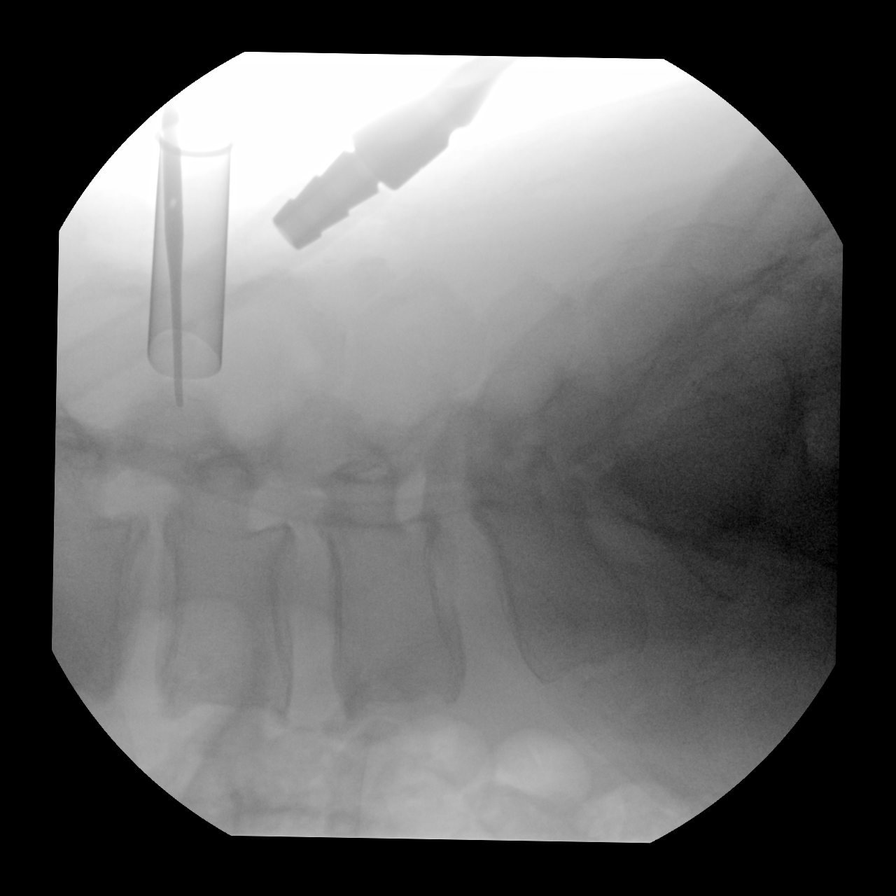

[6 of 6 positions shown; findings below may reference images not displayed]

FINDINGS: Multiple sequential films obtained in the operating room for lumbar
spine level localization were obtained. On the first image a
surgical probe is identified posterior to the L4 vertebra. On the
second image a surgical probe tip is posterior to the L4-5 disc
space. On the third image there is a instrument posterior to the
L3-4 disc space. On the forth image a surgical probe is noted
posterior to the L3-4 disc space. On the fifth image surgical
instrument is posterior to the superior endplate of L3. On the final
image surgical probe instrument is posterior to the superior
endplate of L3.
IMPRESSION: Intraoperative localization of the lumbar spine as described above.

## 2021-12-17 IMAGING — RF DG C-ARM 1-60 MIN
1 series · 6 of 6 positions shown · non-contrast
Comparison: MRI lumbar spine 09/15/2020

CLINICAL DATA: Lumbar spine surgery

EXAM:
OPERATIVE LUMBAR SPINE 1 VIEW(S)
Fluoroscopy time: 8 seconds

[Series 1: dg x-ray · 0.20mm/px · 6 of 6 slices shown]
[im 1/6]
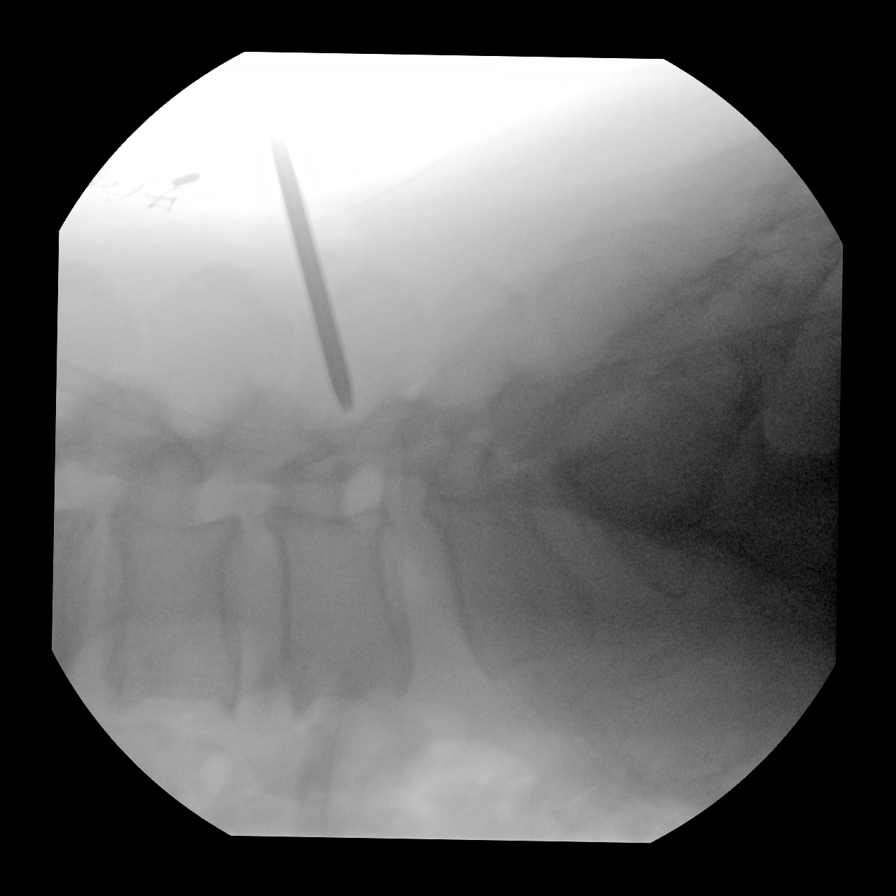
[im 2/6]
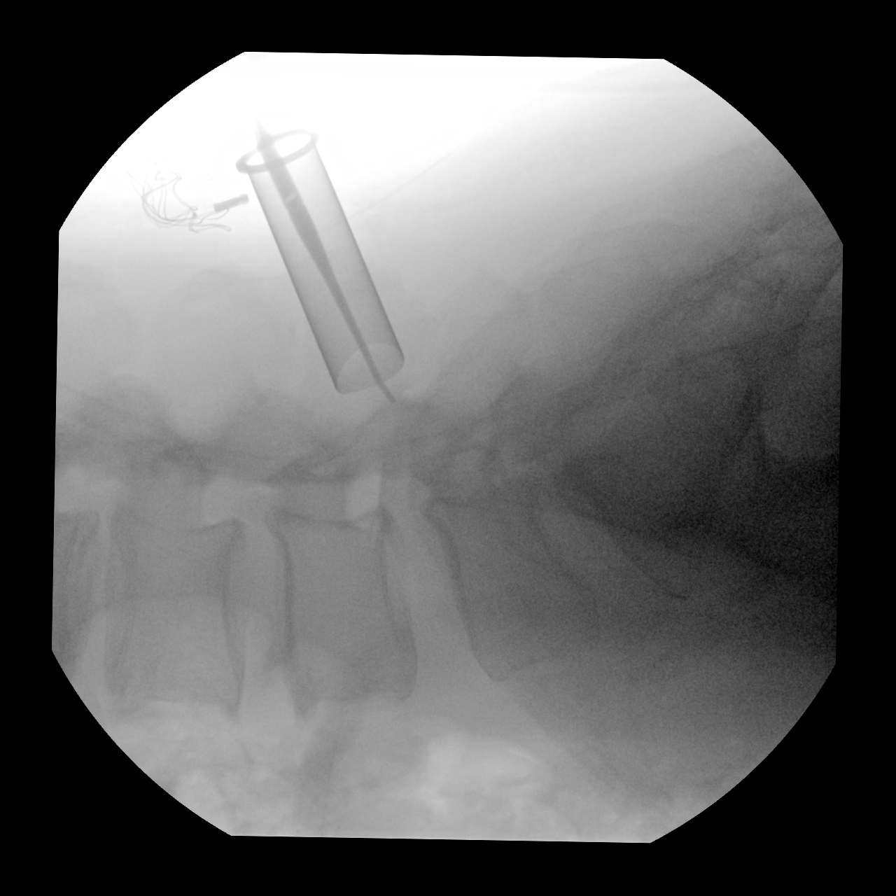
[im 3/6]
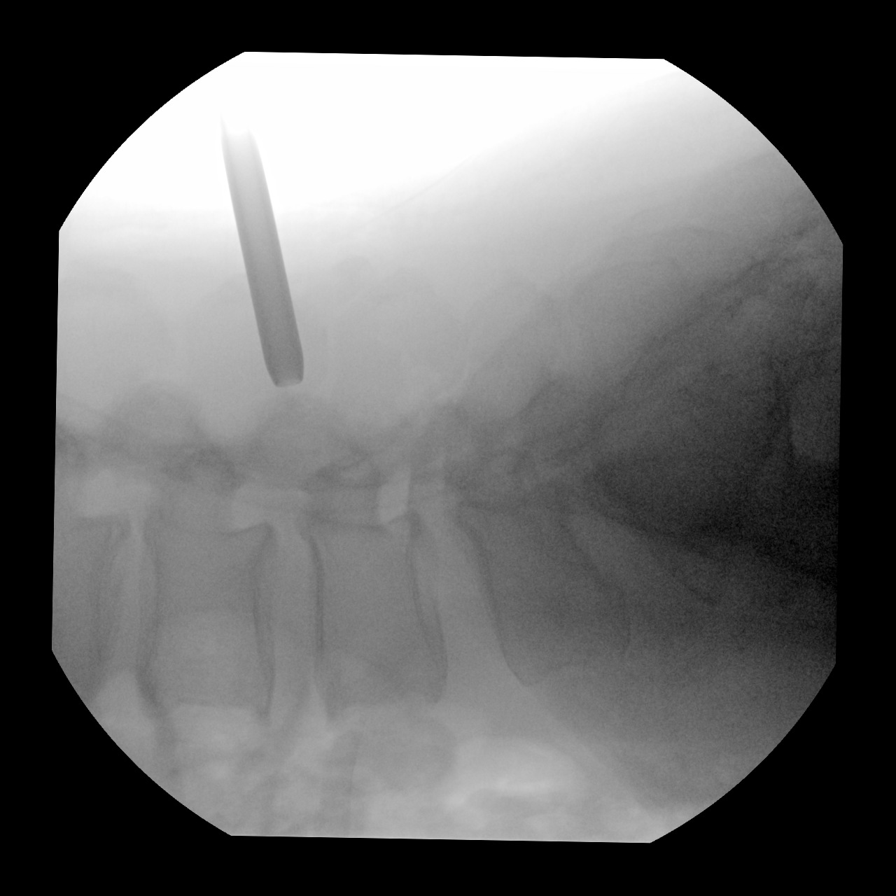
[im 4/6]
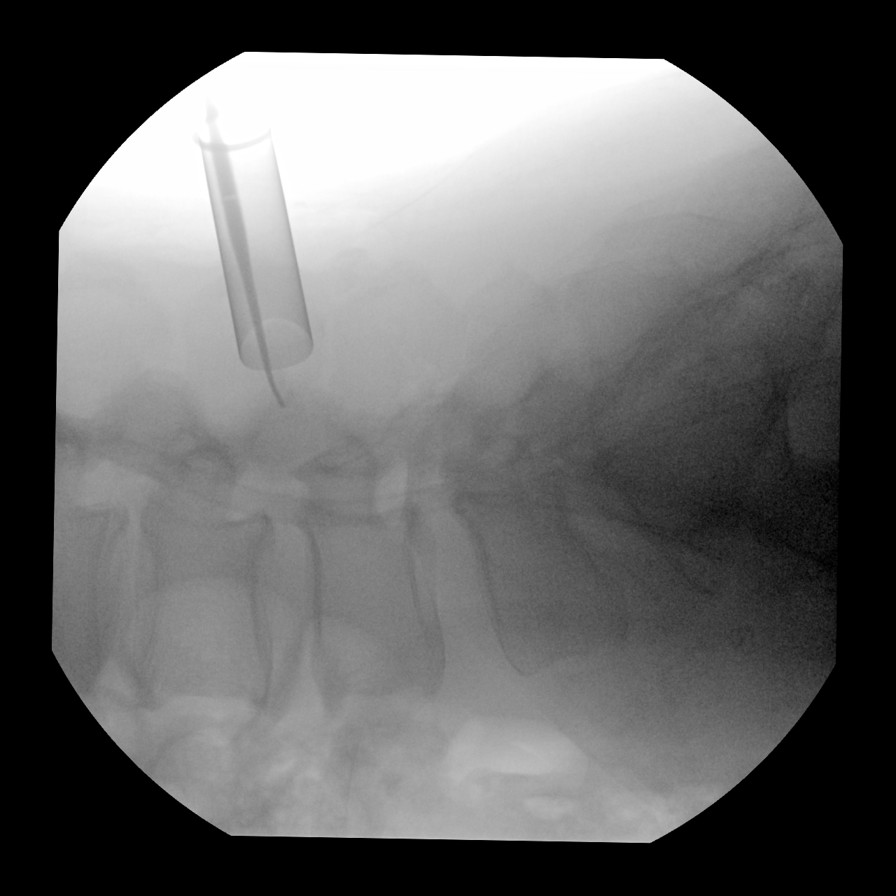
[im 5/6]
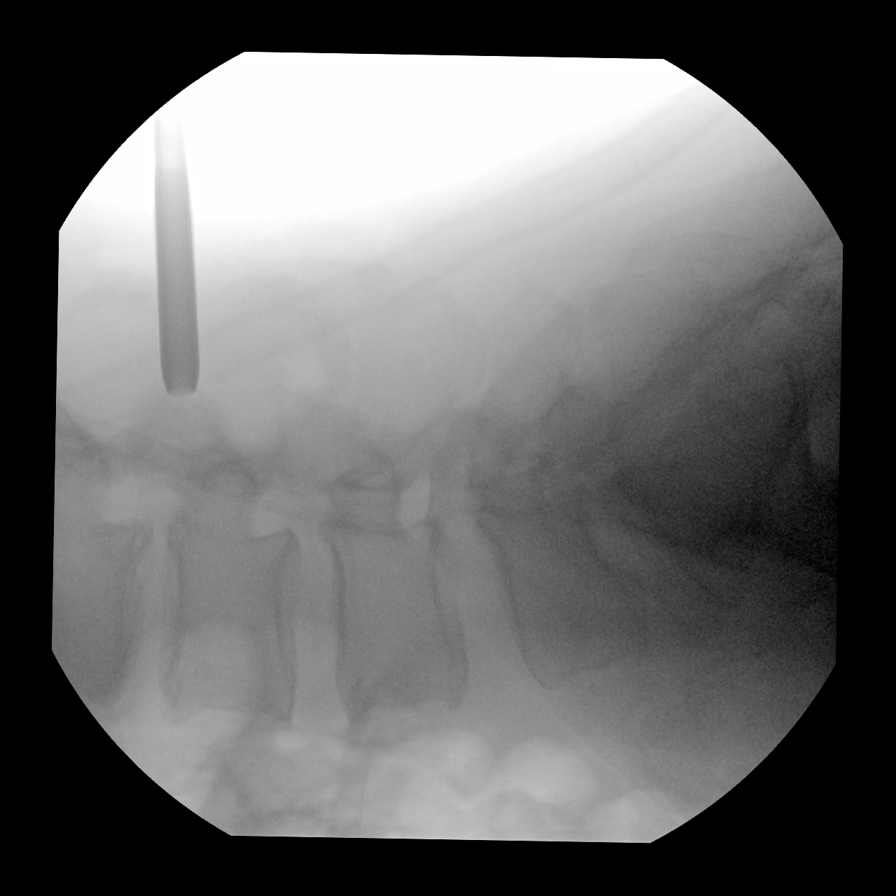
[im 6/6]
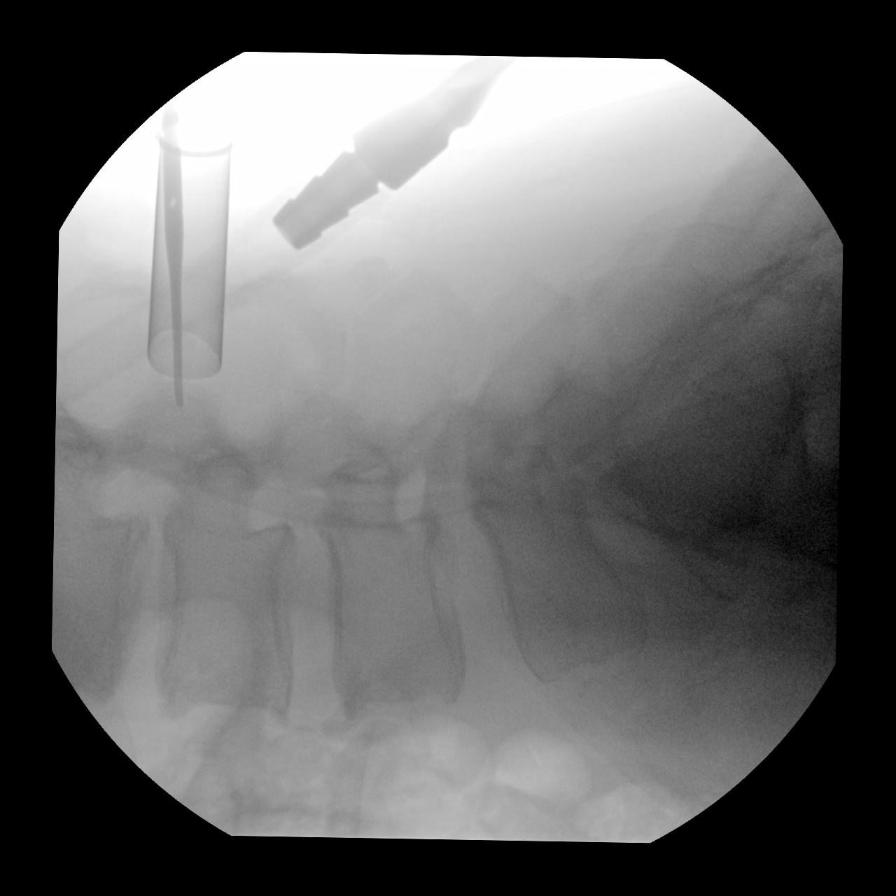

[6 of 6 positions shown; findings below may reference images not displayed]

FINDINGS: Multiple sequential films obtained in the operating room for lumbar
spine level localization were obtained. On the first image a
surgical probe is identified posterior to the L4 vertebra. On the
second image a surgical probe tip is posterior to the L4-5 disc
space. On the third image there is a instrument posterior to the
L3-4 disc space. On the forth image a surgical probe is noted
posterior to the L3-4 disc space. On the fifth image surgical
instrument is posterior to the superior endplate of L3. On the final
image surgical probe instrument is posterior to the superior
endplate of L3.
IMPRESSION: Intraoperative localization of the lumbar spine as described above.

## 2022-01-08 ENCOUNTER — Ambulatory Visit: Payer: Medicare Other | Admitting: Physician Assistant

## 2022-01-08 ENCOUNTER — Encounter: Payer: Self-pay | Admitting: Physician Assistant

## 2022-01-08 VITALS — BP 144/76 | HR 64 | Ht 73.0 in | Wt 234.0 lb

## 2022-01-08 DIAGNOSIS — R310 Gross hematuria: Secondary | ICD-10-CM | POA: Diagnosis not present

## 2022-01-08 LAB — URINALYSIS, COMPLETE
Bilirubin, UA: NEGATIVE
Glucose, UA: NEGATIVE
Ketones, UA: NEGATIVE
Leukocytes,UA: NEGATIVE
Nitrite, UA: NEGATIVE
Protein,UA: NEGATIVE
Specific Gravity, UA: 1.02 (ref 1.005–1.030)
Urobilinogen, Ur: 0.2 mg/dL (ref 0.2–1.0)
pH, UA: 7 (ref 5.0–7.5)

## 2022-01-08 LAB — MICROSCOPIC EXAMINATION
Bacteria, UA: NONE SEEN
WBC, UA: NONE SEEN /hpf (ref 0–5)

## 2022-01-08 NOTE — Progress Notes (Signed)
01/08/2022 3:18 PM   [name] 1946/04/03 [id number]  CC: Chief Complaint  Patient presents with   Follow-up   Hematuria   HPI: John Strong is a 76 y.o. male with PMH elevated PSA on surveillance and ED who presents today for evaluation of painless gross hematuria.   Today he reports sudden onset of gross hematuria 6 days ago.  It has progressively improved since.  He saw his PCP 5 days ago for evaluation of the same.  UA at that time was notable for gross hematuria and rare bacteria and he was started on empiric Cipro for possible UTI, however his urine culture has since finalized with no growth.  He denies flank pain or dysuria associated with this.  He denies a history of hematuria.  He has a remote 15-pack-year smoking history, having quit approximately 30 years ago.  He denies any recent unintentional weight loss or fatigue.  In-office UA today positive for trace lysed blood; urine microscopy with 3-10 RBCs/HPF.  PMH: Past Medical History:  Diagnosis Date   Arthritis    Diabetes mellitus without complication (HCC)    GERD (gastroesophageal reflux disease)    Hyperlipidemia    Hypertension    Thyroid disease     Surgical History: Past Surgical History:  Procedure Laterality Date   LUMBAR LAMINECTOMY/DECOMPRESSION MICRODISCECTOMY N/A 11/14/2020   Procedure: L2-5 MINIMALLY INVASIVE DECOMPRESSION;  Surgeon: Meade Maw, MD;  Location: [facility] ORS;  Service: Neurosurgery;  Laterality: N/A;   THYROIDECTOMY      Home Medications:  Allergies as of 01/08/2022       Reactions   Lisinopril    Other reaction(s): Angioedema        Medication List        Accurate as of Jan 08, 2022  3:18 PM. If you have any questions, ask your nurse or doctor.          STOP taking these medications    ciprofloxacin 500 MG tablet Commonly known as: CIPRO Stopped by: Debroah Loop, PA-C       TAKE these medications    carvedilol 3.125 MG tablet Commonly  known as: COREG Take by mouth.   gabapentin 300 MG capsule Commonly known as: NEURONTIN Take 300-600 mg by mouth See admin instructions. Tale 300 mg in the morning and 600 mg at bedtime   hydrochlorothiazide 25 MG tablet Commonly known as: HYDRODIURIL Take 1 tablet by mouth daily.   levothyroxine 112 MCG tablet Commonly known as: SYNTHROID Take 112 mcg by mouth daily.   metFORMIN 500 MG tablet Commonly known as: GLUCOPHAGE Take 500 mg by mouth 2 (two) times daily.   NIFEdipine 30 MG 24 hr tablet Commonly known as: ADALAT CC Take 30 mg by mouth 2 (two) times daily.   omeprazole 20 MG capsule Commonly known as: PRILOSEC Take 20 mg by mouth daily.   oxyCODONE 5 MG immediate release tablet Commonly known as: Oxy IR/ROXICODONE Take 1 tablet (5 mg total) by mouth every 3 (three) hours as needed for moderate pain ((score 4 to 6)).   rosuvastatin 10 MG tablet Commonly known as: CRESTOR Take 10 mg by mouth at bedtime.   tadalafil 10 MG tablet Commonly known as: Cialis Take 1-2 tablets (10-20 mg total) by mouth daily as needed for erectile dysfunction.        Allergies:  Allergies  Allergen Reactions   Lisinopril     Other reaction(s): Angioedema    Family History: Family History  Problem Relation Age of Onset  Diabetes Mother    Colon cancer Father     Social History:   reports that he has quit smoking. His smoking use included cigarettes. He has been exposed to tobacco smoke. He has never used smokeless tobacco. He reports that he does not currently use alcohol. He reports current drug use. Drugs: Cocaine and Marijuana.  Physical Exam: BP (!) 144/76   Pulse 64   Ht $R'6\' 1"'kK$  (1.854 m)   Wt 234 lb (106.1 kg)   BMI 30.87 kg/m   Constitutional:  Alert and oriented, no acute distress, nontoxic appearing HEENT: [place], AT Cardiovascular: No clubbing, cyanosis, or edema Respiratory: Normal respiratory effort, no increased work of breathing Skin: No rashes, bruises or  suspicious lesions Neurologic: Grossly intact, no focal deficits, moving all 4 extremities Psychiatric: Normal mood and affect  Laboratory Data: Results for orders placed or performed in visit on 01/08/22  Microscopic Examination   Urine  Result Value Ref Range   WBC, UA None seen 0 - 5 /hpf   RBC 3-10 (A) 0 - 2 /hpf   Epithelial Cells (non renal) 0-10 0 - 10 /hpf   Bacteria, UA None seen None seen/Few  Urinalysis, Complete  Result Value Ref Range   Specific Gravity, UA 1.020 1.005 - 1.030   pH, UA 7.0 5.0 - 7.5   Color, UA Yellow Yellow   Appearance Ur Clear Clear   Leukocytes,UA Negative Negative   Protein,UA Negative Negative/Trace   Glucose, UA Negative Negative   Ketones, UA Negative Negative   RBC, UA Trace (A) Negative   Bilirubin, UA Negative Negative   Urobilinogen, Ur 0.2 0.2 - 1.0 mg/dL   Nitrite, UA Negative Negative   Microscopic Examination See below:    Assessment & Plan:   1. Gross hematuria 77 year old male with a remote 15-pack-year smoking history now presents with 6 days of painless hematuria.  Some persistent microscopic hematuria noted on UA today.  Okay to stop Cipro in light of negative urine culture.  I had a lengthy conversation today with the patient regarding the etiology of blood in the urine.  I explained that blood in the urine can be caused by a myriad of factors, including but not limited to infection, stones, cysts, anticoagulation, and urinary tract malignancies.  I explained that the recommended work-up for blood in the urine is twofold and includes a CT urogram for evaluation of the upper urinary tract including kidneys and ureters as well as a cystoscopy for evaluation of the urethra and bladder.  I explained that these two studies complement one another in reviewing the entire urinary tract for possible causes of bleeding.  I recommended that we proceed with this at this time.  Patient agreed; CTU ordered and follow-up cysto with CTU results  scheduled.  In the interim, counseled the patient on signs and symptoms of gross hematuria requiring urgent evaluation, including thick, ketchup-like urinary output; passage of large clots; dark, maroon-colored urine; lower abdominal pain; lumbar pain; abdominal distention; and the inability to urinate.  I advised him to contact the office for assistance if he develops these symptoms during routine office hours, 8 AM to 5 PM Monday through Friday.  If outside those hours, I advised him to proceed to the emergency department. He expressed understanding.    - Urinalysis, Complete - CT HEMATURIA WORKUP; Future  Return in about 3 weeks (around 01/29/2022) for Cysto and CTU results with Dr. Diamantina Providence.  Debroah Loop, PA-C  [address],  [place], [place] [address] [phone]

## 2022-01-22 ENCOUNTER — Ambulatory Visit
Admission: RE | Admit: 2022-01-22 | Discharge: 2022-01-22 | Disposition: A | Discharge status: Home / Self Care | Payer: Medicare Other | Source: Ambulatory Visit | Attending: Physician Assistant | Admitting: Physician Assistant

## 2022-01-22 DIAGNOSIS — R310 Gross hematuria: Secondary | ICD-10-CM | POA: Insufficient documentation

## 2022-01-22 MED ORDER — IOHEXOL 300 MG/ML  SOLN
100.0000 mL | Freq: Once | INTRAMUSCULAR | Status: AC | PRN
  Administered 2022-01-22: 100 mL via INTRAVENOUS

## 2022-02-12 ENCOUNTER — Ambulatory Visit: Payer: Medicare Other | Admitting: Urology

## 2022-02-12 VITALS — BP 163/80 | HR 67 | Ht 73.0 in | Wt 234.0 lb

## 2022-02-12 DIAGNOSIS — R310 Gross hematuria: Secondary | ICD-10-CM | POA: Diagnosis not present

## 2022-02-12 LAB — URINALYSIS, COMPLETE
Bilirubin, UA: NEGATIVE
Glucose, UA: NEGATIVE
Ketones, UA: NEGATIVE
Leukocytes,UA: NEGATIVE
Nitrite, UA: NEGATIVE
Protein,UA: NEGATIVE
Specific Gravity, UA: 1.025 (ref 1.005–1.030)
Urobilinogen, Ur: 0.2 mg/dL (ref 0.2–1.0)
pH, UA: 5.5 (ref 5.0–7.5)

## 2022-02-12 LAB — MICROSCOPIC EXAMINATION

## 2022-02-12 NOTE — Patient Instructions (Signed)
Increase tadalafil to 20 mg as needed, take at least an hour before sexual activity.

## 2022-02-12 NOTE — Progress Notes (Signed)
Cystoscopy Procedure Note:  Indication: Gross hematuria  After informed consent and discussion of the procedure and its risks, Barnell Shieh was positioned and prepped in the standard fashion. Cystoscopy was performed with a flexible cystoscope. The urethra, bladder neck and entire bladder was visualized in a standard fashion. The prostate was moderate in size with a high bladder neck. The ureteral orifices were visualized in their normal location and orientation.  Bladder mucosa normal throughout, no abnormalities on retroflexion.  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

## 2022-02-13 LAB — CYTOLOGY - NON PAP

## 2022-04-15 ENCOUNTER — Ambulatory Visit
Admission: EM | Admit: 2022-04-15 | Discharge: 2022-04-15 | Disposition: A | Discharge status: Home / Self Care | Payer: Medicare Other | Attending: Emergency Medicine | Admitting: Emergency Medicine

## 2022-04-15 ENCOUNTER — Ambulatory Visit (INDEPENDENT_AMBULATORY_CARE_PROVIDER_SITE_OTHER): Payer: Medicare Other

## 2022-04-15 DIAGNOSIS — U071 COVID-19: Secondary | ICD-10-CM | POA: Insufficient documentation

## 2022-04-15 DIAGNOSIS — K5900 Constipation, unspecified: Secondary | ICD-10-CM | POA: Diagnosis present

## 2022-04-15 DIAGNOSIS — R059 Cough, unspecified: Secondary | ICD-10-CM

## 2022-04-15 DIAGNOSIS — R109 Unspecified abdominal pain: Secondary | ICD-10-CM

## 2022-04-15 DIAGNOSIS — J029 Acute pharyngitis, unspecified: Secondary | ICD-10-CM

## 2022-04-15 LAB — RESP PANEL BY RT-PCR (FLU A&B, COVID) ARPGX2
Influenza A by PCR: NEGATIVE
Influenza B by PCR: NEGATIVE
SARS Coronavirus 2 by RT PCR: POSITIVE — AB

## 2022-04-15 MED ORDER — BENZONATATE 100 MG PO CAPS: 200.0000 mg | ORAL_CAPSULE | Freq: Three times a day (TID) | ORAL | 0 refills | Status: DC

## 2022-04-15 MED ORDER — PROMETHAZINE-DM 6.25-15 MG/5ML PO SYRP: 5.0000 mL | ORAL_SOLUTION | Freq: Four times a day (QID) | ORAL | 0 refills | Status: DC | PRN

## 2022-04-15 MED ORDER — MOLNUPIRAVIR EUA 200MG CAPSULE: 4.0000 | ORAL_CAPSULE | Freq: Two times a day (BID) | ORAL | 0 refills | Status: AC

## 2022-04-15 NOTE — Discharge Instructions (Addendum)
You have tested positive for COVID-19 today.  You will need to quarantine for 5 days following the onset of your symptoms.  Even though your symptoms started 7 days ago with a cough I am concerned that the sore throat is a new symptom and will consider today day 1.  After 5-day quarantine you will need to wear a mask around other people for additional 5 days.  Take the molnupiravir twice daily for 5 days for treatment of COVID-19.  Use over-the-counter Tylenol and ibuprofen according to the package instructions as needed for pain or fever.  Use the Tessalon Perles every 8 hours during the day as needed for cough.  Take them with a small sip of water.  They may give you some numbness to the base of your tongue or metallic taste in mouth, this is normal.  Use the Promethazine DM cough syrup at night to help with cough and congestion.  Be mindful that this will make you drowsy so make sure that you have your bearings about you before you get up in the middle the night to use the bathroom.  Your abdominal x-ray did not show that you have an increase stool burden in your right colon and also your rectum.  You can use over-the-counter MiraLAX to help resolve this constipation.  1 capful in 8 ounces of a beverage of your choice daily until you achieve normal bowel movements.  If you develop shortness of breath,, especially at rest, feel you cannot catch her breath, you cannot speak in full sentences, or is a late sign your lips are turning blue you need to go to the emergency department for evaluation.

## 2022-04-15 NOTE — ED Provider Notes (Addendum)
[facility]    CSN: [id number] Arrival date & time: 04/15/22  1124      History   Chief Complaint Chief Complaint  Patient presents with   Cough   Abdominal Pain   Sore Throat    HPI John Strong is a 76 y.o. male.   HPI  76 year old male here for evaluation of sore throat, cough, and abdominal soreness.  Patient reports that he has been experiencing a cough that is productive for yellow mucus since 04/08/2022.  This morning he woke up and he had a sore throat that is predominantly on the left-hand side and also some soreness in his upper abdomen.  He was recently on a cruise with his wife and she tested positive for COVID on 04/04/2022.  He tested at that time but was negative.  His symptoms began on the 15th and he has not retested.  He denies any fever, runny nose, nasal congestion, ear pain, shortness of breath, wheezing, nausea, vomiting, or diarrhea.  He does state that he has not had a bowel movement for the last 4 to 5 days.  This is not normal for him.  Past Medical History:  Diagnosis Date   Arthritis    Diabetes mellitus without complication (HCC)    GERD (gastroesophageal reflux disease)    Hyperlipidemia    Hypertension    Thyroid disease     Patient Active Problem List   Diagnosis Date Noted   Neurogenic claudication due to lumbar spinal stenosis 11/14/2020   Leg pain 06/17/2020   PAD (peripheral artery disease) (HCC) 06/17/2020   B12 deficiency 07/07/2019   Other male erectile dysfunction 07/07/2018   Encounter for general adult medical examination without abnormal findings 12/31/2017   Vaccine counseling 12/31/2017   History of normocytic normochromic anemia 08/28/2017   Acquired hypothyroidism 08/26/2017   Essential hypertension 08/26/2017   Pure hypercholesterolemia 08/26/2017   Type 2 diabetes mellitus with diabetic neuropathy, without long-term current use of insulin (HCC) 08/26/2017   Vitamin D deficiency 08/26/2017   Spinal stenosis  of lumbar region 09/08/2016    Past Surgical History:  Procedure Laterality Date   LUMBAR LAMINECTOMY/DECOMPRESSION MICRODISCECTOMY N/A 11/14/2020   Procedure: L2-5 MINIMALLY INVASIVE DECOMPRESSION;  Surgeon: Venetia NightYarbrough, [place], MD;  Location: [facility] ORS;  Service: Neurosurgery;  Laterality: N/A;   THYROIDECTOMY         Home Medications    Prior to Admission medications   Medication Sig Start Date End Date Taking? Authorizing Provider  benzonatate (TESSALON) 100 MG capsule Take 2 capsules (200 mg total) by mouth every 8 (eight) hours. 04/15/22  Yes Becky Augustayan, Doralee Kocak, NP  molnupiravir EUA (LAGEVRIO) 200 mg CAPS capsule Take 4 capsules (800 mg total) by mouth 2 (two) times daily for 5 days. 04/15/22 04/20/22 Yes Becky Augustayan, Jaymee Tilson, NP  promethazine-dextromethorphan (PROMETHAZINE-DM) 6.25-15 MG/5ML syrup Take 5 mLs by mouth 4 (four) times daily as needed. 04/15/22  Yes Becky Augustayan, Sharrod Achille, NP  carvedilol (COREG) 3.125 MG tablet Take by mouth. 10/07/21 10/07/22  [provider]  gabapentin (NEURONTIN) 300 MG capsule Take 300-600 mg by mouth See admin instructions. Tale 300 mg in the morning and 600 mg at bedtime 05/16/20   [provider]  hydrochlorothiazide (HYDRODIURIL) 25 MG tablet Take 1 tablet by mouth daily. 10/09/21   [provider]  levothyroxine (SYNTHROID) 112 MCG tablet Take 112 mcg by mouth daily. 01/19/19   [provider]  metFORMIN (GLUCOPHAGE) 500 MG tablet Take 500 mg by mouth 2 (two) times daily. 03/20/20  [provider]  NIFEdipine (ADALAT CC) 30 MG 24 hr tablet Take 30 mg by mouth 2 (two) times daily. 04/11/20   [provider]  omeprazole (PRILOSEC) 20 MG capsule Take 20 mg by mouth daily. 11/02/19   [provider]  rosuvastatin (CRESTOR) 10 MG tablet Take 10 mg by mouth at bedtime. 08/27/20   [provider]  tadalafil (CIALIS) 10 MG tablet Take 1-2 tablets (10-20 mg total) by mouth daily as needed for erectile dysfunction.  10/31/21   Sondra Come, MD    Family History Family History  Problem Relation Age of Onset   Diabetes Mother    Colon cancer Father     Social History Social History   Tobacco Use   Smoking status: Former    Types: Cigarettes    Passive exposure: Past   Smokeless tobacco: Never  Vaping Use   Vaping Use: Never used  Substance Use Topics   Alcohol use: Not Currently   Drug use: Yes    Types: Cocaine, Marijuana    Comment: 30 YEARS AGO     Allergies   Lisinopril   Review of Systems Review of Systems  Constitutional:  Negative for fever.  HENT:  Negative for congestion, ear pain, rhinorrhea and sore throat.   Respiratory:  Positive for cough. Negative for shortness of breath and wheezing.   Gastrointestinal:  Positive for abdominal pain and constipation. Negative for diarrhea, nausea and vomiting.  Skin:  Negative for rash.  Hematological: Negative.   Psychiatric/Behavioral: Negative.       Physical Exam Triage Vital Signs ED Triage Vitals  Enc Vitals Group     BP 04/15/22 1151 128/73     Pulse Rate 04/15/22 1151 (!) 58     Resp --      Temp 04/15/22 1151 98.1 F (36.7 C)     Temp Source 04/15/22 1151 Oral     SpO2 04/15/22 1151 98 %     Weight 04/15/22 1149 218 lb (98.9 kg)     Height 04/15/22 1149 6\' 2"  (1.88 m)     Head Circumference --      Peak Flow --      Pain Score 04/15/22 1149 6     Pain Loc --      Pain Edu? --      Excl. in GC? --    No data found.  Updated Vital Signs BP 128/73 (BP Location: Left Arm)   Pulse (!) 58   Temp 98.1 F (36.7 C) (Oral)   Ht 6\' 2"  (1.88 m)   Wt 218 lb (98.9 kg)   SpO2 98%   BMI 27.99 kg/m   Visual Acuity Right Eye Distance:   Left Eye Distance:   Bilateral Distance:    Right Eye Near:   Left Eye Near:    Bilateral Near:     Physical Exam Vitals and nursing note reviewed.  Constitutional:      Appearance: Normal appearance. He is not ill-appearing.  HENT:     Head: Normocephalic and  atraumatic.     Right Ear: Tympanic membrane, ear canal and external ear normal. There is no impacted cerumen.     Left Ear: Tympanic membrane, ear canal and external ear normal. There is no impacted cerumen.     Nose: Congestion and rhinorrhea present.     Mouth/Throat:     Mouth: Mucous membranes are moist.     Pharynx: Oropharynx is clear. Posterior oropharyngeal erythema present. No oropharyngeal  exudate.  Cardiovascular:     Rate and Rhythm: Normal rate and regular rhythm.     Pulses: Normal pulses.     Heart sounds: Normal heart sounds. No murmur heard.    No friction rub. No gallop.  Pulmonary:     Effort: Pulmonary effort is normal.     Breath sounds: Normal breath sounds. No wheezing, rhonchi or rales.  Abdominal:     General: Abdomen is flat. Bowel sounds are normal. There is no distension.     Palpations: Abdomen is soft.     Tenderness: There is abdominal tenderness. There is no guarding or rebound.  Musculoskeletal:     Cervical back: Normal range of motion and neck supple.  Lymphadenopathy:     Cervical: Cervical adenopathy present.  Skin:    General: Skin is warm and dry.     Capillary Refill: Capillary refill takes less than 2 seconds.     Findings: No erythema or rash.  Neurological:     General: No focal deficit present.     Mental Status: He is alert and oriented to person, place, and time.  Psychiatric:        Mood and Affect: Mood normal.        Behavior: Behavior normal.        Thought Content: Thought content normal.        Judgment: Judgment normal.      UC Treatments / Results  Labs (all labs ordered are listed, but only abnormal results are displayed) Labs Reviewed  RESP PANEL BY RT-PCR (FLU A&B, COVID) ARPGX2 - Abnormal; Notable for the following components:      Result Value   SARS Coronavirus 2 by RT PCR POSITIVE (*)    All other components within normal limits    EKG   Radiology DG Chest 2 View  Result Date: 04/15/2022 CLINICAL  DATA:  Sore throat and cough. EXAM: CHEST - 2 VIEW COMPARISON:  None Available. FINDINGS: The cardiomediastinal silhouette is normal. There is no focal consolidation or pulmonary edema. There is no pleural effusion or pneumothorax There is no acute osseous abnormality. IMPRESSION: No radiographic evidence of acute cardiopulmonary process. Electronically Signed   By: Lesia Hausen M.D.   On: 04/15/2022 13:21   DG Abdomen 1 View  Result Date: 04/15/2022 CLINICAL DATA:  Abdominal pain and sore throat.  COVID exposure. EXAM: ABDOMEN - 1 VIEW COMPARISON:  None Available. FINDINGS: The bowel gas pattern is normal. Increased stool burden in the rectum and right colon. No radio-opaque calculi or other significant radiographic abnormality are seen. No acute osseous abnormality. IMPRESSION: 1. No acute findings. Electronically Signed   By: Obie Dredge M.D.   On: 04/15/2022 13:20    Procedures Procedures (including critical care time)  Medications Ordered in UC Medications - No data to display  Initial Impression / Assessment and Plan / UC Course  I have reviewed the triage vital signs and the nursing notes.  Pertinent labs & imaging results that were available during my care of the patient were reviewed by me and considered in my medical decision making (see chart for details).   Patient is a pleasant, nontoxic-appearing 40 old male here for evaluation of respiratory abdominal complaints as outlined in HPI above.  Patient's physical exam reveals pearly-gray tympanic membranes bilaterally with normal light reflex and clear external auditory canals.  Nasal mucosa is mildly edematous and slightly erythematous with scant clear discharge in both nares.  Oropharyngeal exam reveals very mild posterior  pharyngeal erythema and clear postnasal drip.  Patient does have bilateral anterior cervical adenopathy on exam that is tender to palpation.  More so on the left than the right where the patient is complaining of  pain.  Cardiopulmonary exam reveals S1-S2 heart sounds with regular rate and rhythm and lung sounds that are clear to auscultation all fields.  Patient is requested COVID and RSV testing which I will order.  Also look for influenza.  In addition, I will order chest x-ray and KUB of the abdomen to look for possible pneumonia and constipation.  Patient is COVID-positive but is negative for flu and RSV.  Chest x-ray independently reviewed and evaluated by me.  Impression: There is a questionable streaky opacity versus prominent pulmonary vasculature in the right lung base.  Remainder lung fields are unremarkable.  Radiology overread is pending. Radiology impression states no radiographic evidence of acute cardiopulmonary process.  KUB of abdomen independent reviewed and evaluated by me.  Impression: Patient does have a scattered stool burden throughout his colon and rectum.  Nonspecific bowel gas pattern. Radiology overread is pending. Radiographic findings state bowel gas pattern is normal.  Increase stool burden in the rectum and right colon.  No other acute findings.  Given that patient has a history of anemia, hypercholesterolism, type 2 diabetes, and PAD I will treat him for COVID-19 with antivirals even though his symptoms began 7 days ago.  I will discharge him home on molnupiravir twice daily for 5 days.  Patient can use over-the-counter MiraLAX to help with his stool burden.   Final Clinical Impressions(s) / UC Diagnoses   Final diagnoses:  COVID-19  Constipation, unspecified constipation type     Discharge Instructions      You have tested positive for COVID-19 today.  You will need to quarantine for 5 days following the onset of your symptoms.  Even though your symptoms started 7 days ago with a cough I am concerned that the sore throat is a new symptom and will consider today day 1.  After 5-day quarantine you will need to wear a mask around other people for additional 5  days.  Take the molnupiravir twice daily for 5 days for treatment of COVID-19.  Use over-the-counter Tylenol and ibuprofen according to the package instructions as needed for pain or fever.  Use the Tessalon Perles every 8 hours during the day as needed for cough.  Take them with a small sip of water.  They may give you some numbness to the base of your tongue or metallic taste in mouth, this is normal.  Use the Promethazine DM cough syrup at night to help with cough and congestion.  Be mindful that this will make you drowsy so make sure that you have your bearings about you before you get up in the middle the night to use the bathroom.  Your abdominal x-ray did not show that you have an increase stool burden in your right colon and also your rectum.  You can use over-the-counter MiraLAX to help resolve this constipation.  1 capful in 8 ounces of a beverage of your choice daily until you achieve normal bowel movements.  If you develop shortness of breath,, especially at rest, feel you cannot catch her breath, you cannot speak in full sentences, or is a late sign your lips are turning blue you need to go to the emergency department for evaluation.     ED Prescriptions     Medication Sig Dispense Auth. Provider  molnupiravir EUA (LAGEVRIO) 200 mg CAPS capsule Take 4 capsules (800 mg total) by mouth 2 (two) times daily for 5 days. 40 capsule Becky Augusta, NP   benzonatate (TESSALON) 100 MG capsule Take 2 capsules (200 mg total) by mouth every 8 (eight) hours. 21 capsule Becky Augusta, NP   promethazine-dextromethorphan (PROMETHAZINE-DM) 6.25-15 MG/5ML syrup Take 5 mLs by mouth 4 (four) times daily as needed. 118 mL Becky Augusta, NP      PDMP not reviewed this encounter.   Becky Augusta, NP 04/15/22 1328    Becky Augusta, NP 04/15/22 1329

## 2022-04-15 NOTE — ED Triage Notes (Signed)
Patient presents to UC for sore throat -- started this AM and coughing up yellow mucus -- started around the 15th of this month. Patient also c/o stomach pains.   Patient was recently on a cruise. Patients wife tested positive for COVID -- 04/04/2022. And patient symptoms started on the 15th.

## 2022-05-02 ENCOUNTER — Other Ambulatory Visit: Payer: Medicare Other

## 2022-05-02 DIAGNOSIS — R972 Elevated prostate specific antigen [PSA]: Secondary | ICD-10-CM

## 2022-05-03 LAB — PSA TOTAL (REFLEX TO FREE): Prostate Specific Ag, Serum: 7.3 ng/mL — ABNORMAL HIGH (ref 0.0–4.0)

## 2022-05-03 LAB — FPSA% REFLEX
% FREE PSA: 10.7 %
PSA, FREE: 0.78 ng/mL

## 2022-05-07 ENCOUNTER — Ambulatory Visit: Payer: Medicare Other | Admitting: Urology

## 2022-05-07 ENCOUNTER — Encounter: Payer: Self-pay | Admitting: Urology

## 2022-05-07 VITALS — BP 176/74 | HR 62 | Ht 73.0 in | Wt 232.2 lb

## 2022-05-07 DIAGNOSIS — R972 Elevated prostate specific antigen [PSA]: Secondary | ICD-10-CM

## 2022-05-07 DIAGNOSIS — N401 Enlarged prostate with lower urinary tract symptoms: Secondary | ICD-10-CM

## 2022-05-07 DIAGNOSIS — N529 Male erectile dysfunction, unspecified: Secondary | ICD-10-CM | POA: Diagnosis not present

## 2022-05-07 DIAGNOSIS — N138 Other obstructive and reflux uropathy: Secondary | ICD-10-CM

## 2022-05-07 MED ORDER — TAMSULOSIN HCL 0.4 MG PO CAPS: 0.4000 mg | ORAL_CAPSULE | Freq: Every day | ORAL | 11 refills | Status: DC

## 2022-05-07 MED ORDER — SILDENAFIL CITRATE 100 MG PO TABS: 100.0000 mg | ORAL_TABLET | Freq: Every day | ORAL | 6 refills | Status: DC | PRN

## 2022-05-07 NOTE — Progress Notes (Signed)
   05/07/2022 10:38 AM   John Strong September 13, 1945 [id number]  Reason for visit: Follow up elevated PSA, BPH, history of gross hematuria  HPI: 76 year old male who I followed for the above issues.  He has a family history of lethal prostate cancer in his father.  He has had a slowly increasing PSA over the last few years including 4.97 in October 2022, 5.21 in February 2023, and most recently 7.3(10.7% free) in September 2023.  Also recently had CT for hematuria work-up and prostate measured 65 g, equating to a reassuring PSA density of 0.11.  We reviewed the AUA guidelines regarding PSA screening, and that routine screening is not recommended in men over age 25.  We also reviewed that typically a normal PSA would be considered less than 6.5 for men in his mid 26s, and reviewed the reassuring PSA density.  We discussed options including prostate biopsy, prostate MRI, or repeat PSA in 6 months.  Risk and benefits discussed extensively, and using shared decision making he opted for repeat PSA in 6 months.  We discussed the low, but nonzero risk, missing a clinically significant prostate cancer and window of care.  He underwent a negative hematuria work-up in June 2023, suspect BPH as etiology of gross hematuria.  He also reports some weak urinary stream, straining, sensation of incomplete emptying, and nocturia 2 times overnight.  He was interested in a trial of Flomax and risk and benefits were discussed.  He also has had persistent problems with erections despite max dose Cialis.  He has not number of risk factors for ED including hypertension, beta-blockers, hydrochlorothiazide.  He was interested in trial of Viagra 100 mg on demand.  Trial of Flomax for BPH symptoms Cialis changed to Viagra 100 mg as needed for ED RTC 6 months PVR, PSA prior If he continues to rise, re-consider MRI or prostate biopsy  Sondra Come, MD  [facility] [address], [address] [place], [place] [address] [phone]

## 2022-05-07 NOTE — Patient Instructions (Signed)
Erectile Dysfunction ?Erectile dysfunction (ED) is the inability to get or keep an erection in order to have sexual intercourse. ED is considered a symptom of an underlying disorder and is not considered a disease. ED may include: ?Inability to get an erection. ?Lack of enough hardness of the erection to allow penetration. ?Loss of erection before sex is finished. ?What are the causes? ?This condition may be caused by: ?Physical causes, such as: ?Artery problems. This may include heart disease, high blood pressure, atherosclerosis, and diabetes. ?Hormonal problems, such as low testosterone. ?Obesity. ?Nerve problems. This may include back or pelvic injuries, multiple sclerosis, Parkinson's disease, spinal cord injury, and stroke. ?Certain medicines, such as: ?Pain relievers. ?Antidepressants. ?Blood pressure medicines and water pills (diuretics). ?Cancer medicines. ?Antihistamines. ?Muscle relaxants. ?Lifestyle factors, such as: ?Use of drugs such as marijuana, cocaine, or opioids. ?Excessive use of alcohol. ?Smoking. ?Lack of physical activity or exercise. ?Psychological causes, such as: ?Anxiety or stress. ?Sadness or depression. ?Exhaustion. ?Fear about sexual performance. ?Guilt. ?What are the signs or symptoms? ?Symptoms of this condition include: ?Inability to get an erection. ?Lack of enough hardness of the erection to allow penetration. ?Loss of the erection before sex is finished. ?Sometimes having normal erections, but with frequent unsatisfactory episodes. ?Low sexual satisfaction in either partner due to erection problems. ?A curved penis occurring with erection. The curve may cause pain, or the penis may be too curved to allow for intercourse. ?Never having nighttime or morning erections. ?How is this diagnosed? ?This condition is often diagnosed by: ?Performing a physical exam to find other diseases or specific problems with the penis. ?Asking you detailed questions about the problem. ?Doing tests,  such as: ?Blood tests to check for diabetes mellitus or high cholesterol, or to measure hormone levels. ?Other tests to check for underlying health conditions. ?An ultrasound exam to check for scarring. ?A test to check blood flow to the penis. ?Doing a sleep study at home to measure nighttime erections. ?How is this treated? ?This condition may be treated by: ?Medicines, such as: ?Medicine taken by mouth to help you achieve an erection (oral medicine). ?Hormone replacement therapy to replace low testosterone levels. ?Medicine that is injected into the penis. Your health care provider may instruct you how to give yourself these injections at home. ?Medicine that is delivered with a short applicator tube. The tube is inserted into the opening at the tip of the penis, which is the opening of the urethra. A tiny pellet of medicine is put in the urethra. The pellet dissolves and enhances erectile function. This is also called MUSE (medicated urethral system for erections) therapy. ?Vacuum pump. This is a pump with a ring on it. The pump and ring are placed on the penis and used to create pressure that helps the penis become erect. ?Penile implant surgery. In this procedure, you may receive: ?An inflatable implant. This consists of cylinders, a pump, and a reservoir. The cylinders can be inflated with a fluid that helps to create an erection, and they can be deflated after intercourse. ?A semi-rigid implant. This consists of two silicone rubber rods. The rods provide some rigidity. They are also flexible, so the penis can both curve downward in its normal position and become straight for sexual intercourse. ?Blood vessel surgery to improve blood flow to the penis. During this procedure, a blood vessel from a different part of the body is placed into the penis to allow blood to flow around (bypass) damaged or blocked blood vessels. ?Lifestyle changes,   such as exercising more, losing weight, and quitting smoking. ?Follow  these instructions at home: ?Medicines ? ?Take over-the-counter and prescription medicines only as told by your health care provider. Do not increase the dosage without first discussing it with your health care provider. ?If you are using self-injections, do injections as directed by your health care provider. Make sure you avoid any veins that are on the surface of the penis. After giving an injection, apply pressure to the injection site for 5 minutes. ?Talk to your health care provider about how to prevent headaches while taking ED medicines. These medicines may cause a sudden headache due to the increase in blood flow in your body. ?General instructions ?Exercise regularly, as directed by your health care provider. Work with your health care provider to lose weight, if needed. ?Do not use any products that contain nicotine or tobacco. These products include cigarettes, chewing tobacco, and vaping devices, such as e-cigarettes. If you need help quitting, ask your health care provider. ?Before using a vacuum pump, read the instructions that come with the pump and discuss any questions with your health care provider. ?Keep all follow-up visits. This is important. ?Contact a health care provider if: ?You feel nauseous. ?You are vomiting. ?You get sudden headaches while taking ED medicines. ?You have any concerns about your sexual health. ?Get help right away if: ?You are taking oral or injectable medicines and you have an erection that lasts longer than 4 hours. If your health care provider is unavailable, go to the nearest emergency room for evaluation. An erection that lasts much longer than 4 hours can result in permanent damage to your penis. ?You have severe pain in your groin or abdomen. ?You develop redness or severe swelling of your penis. ?You have redness spreading at your groin or lower abdomen. ?You are unable to urinate. ?You experience chest pain or a rapid heartbeat (palpitations) after taking oral  medicines. ?These symptoms may represent a serious problem that is an emergency. Do not wait to see if the symptoms will go away. Get medical help right away. Call your local emergency services (911 in the [place] Do not drive yourself to the hospital. ?Summary ?Erectile dysfunction (ED) is the inability to get or keep an erection during sexual intercourse. ?This condition is diagnosed based on a physical exam, your symptoms, and tests to determine the cause. Treatment varies depending on the cause and may include medicines, hormone therapy, surgery, or a vacuum pump. ?You may need follow-up visits to make sure that you are using your medicines or devices correctly. ?Get help right away if you are taking or injecting medicines and you have an erection that lasts longer than 4 hours. ?This information is not intended to replace advice given to you by your health care provider. Make sure you discuss any questions you have with your health care provider. ?Document Revised: 11/07/2020 Document Reviewed: 11/07/2020 ?Elsevier Patient Education ? 2023 [employer]vier Inc[employer] ? ?

## 2022-06-23 ENCOUNTER — Encounter (INDEPENDENT_AMBULATORY_CARE_PROVIDER_SITE_OTHER): Payer: Self-pay

## 2022-07-22 ENCOUNTER — Other Ambulatory Visit: Payer: Self-pay | Admitting: Family Medicine

## 2022-07-22 DIAGNOSIS — Z9189 Other specified personal risk factors, not elsewhere classified: Secondary | ICD-10-CM

## 2022-07-22 DIAGNOSIS — Z0000 Encounter for general adult medical examination without abnormal findings: Secondary | ICD-10-CM

## 2022-08-05 ENCOUNTER — Ambulatory Visit: Admission: RE | Admit: 2022-08-05 | Payer: Medicare Other | Source: Ambulatory Visit

## 2022-08-13 ENCOUNTER — Ambulatory Visit
Admission: EM | Admit: 2022-08-13 | Discharge: 2022-08-13 | Disposition: A | Discharge status: Home / Self Care | Payer: Medicare Other | Attending: Emergency Medicine | Admitting: Emergency Medicine

## 2022-08-13 DIAGNOSIS — R590 Localized enlarged lymph nodes: Secondary | ICD-10-CM

## 2022-08-13 DIAGNOSIS — Z1152 Encounter for screening for COVID-19: Secondary | ICD-10-CM | POA: Insufficient documentation

## 2022-08-13 DIAGNOSIS — E119 Type 2 diabetes mellitus without complications: Secondary | ICD-10-CM | POA: Insufficient documentation

## 2022-08-13 DIAGNOSIS — R591 Generalized enlarged lymph nodes: Secondary | ICD-10-CM | POA: Diagnosis not present

## 2022-08-13 DIAGNOSIS — J3489 Other specified disorders of nose and nasal sinuses: Secondary | ICD-10-CM | POA: Diagnosis present

## 2022-08-13 DIAGNOSIS — Z79899 Other long term (current) drug therapy: Secondary | ICD-10-CM | POA: Insufficient documentation

## 2022-08-13 DIAGNOSIS — Z87891 Personal history of nicotine dependence: Secondary | ICD-10-CM | POA: Insufficient documentation

## 2022-08-13 DIAGNOSIS — Z8616 Personal history of COVID-19: Secondary | ICD-10-CM | POA: Diagnosis not present

## 2022-08-13 DIAGNOSIS — Z7984 Long term (current) use of oral hypoglycemic drugs: Secondary | ICD-10-CM | POA: Insufficient documentation

## 2022-08-13 LAB — CBC WITH DIFFERENTIAL/PLATELET
Abs Immature Granulocytes: 0.02 10*3/uL (ref 0.00–0.07)
Basophils Absolute: 0.1 10*3/uL (ref 0.0–0.1)
Basophils Relative: 1 %
Eosinophils Absolute: 0.3 10*3/uL (ref 0.0–0.5)
Eosinophils Relative: 4 %
HCT: 40.9 % (ref 39.0–52.0)
Hemoglobin: 14 g/dL (ref 13.0–17.0)
Immature Granulocytes: 0 %
Lymphocytes Relative: 38 %
Lymphs Abs: 2.7 10*3/uL (ref 0.7–4.0)
MCH: 31.8 pg (ref 26.0–34.0)
MCHC: 34.2 g/dL (ref 30.0–36.0)
MCV: 93 fL (ref 80.0–100.0)
Monocytes Absolute: 0.4 10*3/uL (ref 0.1–1.0)
Monocytes Relative: 6 %
Neutro Abs: 3.6 10*3/uL (ref 1.7–7.7)
Neutrophils Relative %: 51 %
Platelets: 254 10*3/uL (ref 150–400)
RBC: 4.4 MIL/uL (ref 4.22–5.81)
RDW: 12.3 % (ref 11.5–15.5)
WBC: 7 10*3/uL (ref 4.0–10.5)
nRBC: 0 % (ref 0.0–0.2)

## 2022-08-13 LAB — GROUP A STREP BY PCR: Group A Strep by PCR: NOT DETECTED

## 2022-08-13 LAB — RESP PANEL BY RT-PCR (RSV, FLU A&B, COVID)  RVPGX2
Influenza A by PCR: NEGATIVE
Influenza B by PCR: NEGATIVE
Resp Syncytial Virus by PCR: NEGATIVE
SARS Coronavirus 2 by RT PCR: NEGATIVE

## 2022-08-13 NOTE — ED Triage Notes (Signed)
Pt c/o sore throat, loss of voice ongoing x3 days, no known exposure. Denies any fevers,chills or bodyaches. No otc tx

## 2022-08-13 NOTE — ED Provider Notes (Signed)
HPI  SUBJECTIVE:  John Strong is a 76 y.o. male who presents with 2 days of a swollen, tender mass underneath the angle of his left jaw.  It has not changed in size since it started.  He also reports some rhinorrhea.  No facial swelling, trismus, gum pain or swelling, swelling under his tongue.  No fevers, body aches, headaches, nasal congestion, ear pain, sore throat, cough, wheeze, shortness of breath, nausea, vomiting, diarrhea, abdominal pain.  No known COVID, flu, RSV exposure.  He got 5 doses of the COVID-vaccine and this years flu vaccine.  No antibiotics in the past month.  No antipyretic in the past 6 hours.  He has tried tea with honey, cough drops.  No aggravating or alleviating factors.  He has a past medical history of COVID in August 23, diabetes, hypertension, hypercholesterolemia, GERD, hypothyroidism.  No history of chronic kidney disease, cancer, multiple myeloma, leukemia, lymphoma.  He quit smoking years ago.  PCP[facility]   Past Medical History:  Diagnosis Date   Arthritis    Diabetes mellitus without complication ([place])    GERD (gastroesophageal reflux disease)    Hyperlipidemia    Hypertension    Thyroid disease     Past Surgical History:  Procedure Laterality Date   LUMBAR LAMINECTOMY/DECOMPRESSION MICRODISCECTOMY N/A 11/14/2020   Procedure: L2-5 MINIMALLY INVASIVE DECOMPRESSION;  Surgeon: Meade Maw, MD;  Location: [facility] ORS;  Service: Neurosurgery;  Laterality: N/A;   THYROIDECTOMY      Family History  Problem Relation Age of Onset   Diabetes Mother    Colon cancer Father     Social History   Tobacco Use   Smoking status: Former    Types: Cigarettes    Passive exposure: Past   Smokeless tobacco: Never  Vaping Use   Vaping Use: Never used  Substance Use Topics   Alcohol use: Not Currently   Drug use: Yes    Types: Cocaine, Marijuana    Comment: 30 YEARS AGO    No current facility-administered medications for this  encounter.  Current Outpatient Medications:    carvedilol (COREG) 3.125 MG tablet, Take by mouth., Disp: , Rfl:    gabapentin (NEURONTIN) 300 MG capsule, Take 300-600 mg by mouth See admin instructions. Tale 300 mg in the morning and 600 mg at bedtime, Disp: , Rfl:    hydrochlorothiazide (HYDRODIURIL) 25 MG tablet, Take 1 tablet by mouth daily., Disp: , Rfl:    levothyroxine (SYNTHROID) 112 MCG tablet, Take 112 mcg by mouth daily., Disp: , Rfl:    metFORMIN (GLUCOPHAGE) 500 MG tablet, Take 500 mg by mouth 2 (two) times daily., Disp: , Rfl:    NIFEdipine (ADALAT CC) 30 MG 24 hr tablet, Take 30 mg by mouth 2 (two) times daily., Disp: , Rfl:    omeprazole (PRILOSEC) 20 MG capsule, Take 20 mg by mouth daily., Disp: , Rfl:    rosuvastatin (CRESTOR) 10 MG tablet, Take 10 mg by mouth at bedtime., Disp: , Rfl:    sildenafil (VIAGRA) 100 MG tablet, Take 1 tablet (100 mg total) by mouth daily as needed for erectile dysfunction (take 30 minutes prior to sexual activity)., Disp: 30 tablet, Rfl: 6   tamsulosin (FLOMAX) 0.4 MG CAPS capsule, Take 1 capsule (0.4 mg total) by mouth daily., Disp: 30 capsule, Rfl: 11  Allergies  Allergen Reactions   Lisinopril     Other reaction(s): Angioedema     ROS  As noted in HPI.   Physical Exam  BP Marland Kitchen)  149/80 (BP Location: Left Arm)   Pulse 66   Temp 98 F (36.7 C) (Oral)   Resp 16   Ht _0  (1.854 m)   Wt 104.3 kg   SpO2 98%   BMI 30.34 kg/m   Constitutional: Well developed, well nourished, no acute distress Eyes:  EOMI, conjunctiva normal bilaterally HENT: Normocephalic, atraumatic,mucus membranes moist.  Tender swelling consistent with a lymph node at the angle of the left jaw.  Left external ear, EAC, TM normal.  No gingival swelling.  Positive dentures.  No nasal congestion.  No maxillary, frontal sinus tenderness.  No parotid gland, submandibular salivary gland swelling or tenderness.  No swelling underneath the tongue.  No expressable purulent  drainage from underneath the tongue.  No other cervical lymphadenopathy. Lymph: No preauricular, posterior auricular, posterior cervical, axillary or inguinal lymphadenopathy. Respiratory: Normal inspiratory effort Cardiovascular: Normal rate GI: nondistended.  No splenomegaly. skin: No rash, skin intact Musculoskeletal: no deformities Neurologic: Alert & oriented x 3, no focal neuro deficits Psychiatric: Speech and behavior appropriate   ED Course   Medications - No data to display  Orders Placed This Encounter  Procedures   Group A Strep by PCR    Standing Status:   Standing    Number of Occurrences:   1   Resp panel by RT-PCR (RSV, Flu A&B, Covid) Anterior Nasal Swab    Standing Status:   Standing    Number of Occurrences:   1   CBC with Differential    Standing Status:   Standing    Number of Occurrences:   1    Results for orders placed or performed during the hospital encounter of 08/13/22 (from the past 24 hour(s))  Group A Strep by PCR     Status: None   Collection Time: 08/13/22  1:31 PM   Specimen: Throat; Sterile Swab  Result Value Ref Range   Group A Strep by PCR NOT DETECTED NOT DETECTED  Resp panel by RT-PCR (RSV, Flu A&B, Covid) Anterior Nasal Swab     Status: None   Collection Time: 08/13/22  1:37 PM   Specimen: Anterior Nasal Swab  Result Value Ref Range   SARS Coronavirus 2 by RT PCR NEGATIVE NEGATIVE   Influenza A by PCR NEGATIVE NEGATIVE   Influenza B by PCR NEGATIVE NEGATIVE   Resp Syncytial Virus by PCR NEGATIVE NEGATIVE  CBC with Differential     Status: None   Collection Time: 08/13/22  1:55 PM  Result Value Ref Range   WBC 7.0 4.0 - 10.5 K/uL   RBC 4.40 4.22 - 5.81 MIL/uL   Hemoglobin 14.0 13.0 - 17.0 g/dL   HCT 40.9 39.0 - 52.0 %   MCV 93.0 80.0 - 100.0 fL   MCH 31.8 26.0 - 34.0 pg   MCHC 34.2 30.0 - 36.0 g/dL   RDW 12.3 11.5 - 15.5 %   Platelets 254 150 - 400 K/uL   nRBC 0.0 0.0 - 0.2 %   Neutrophils Relative % 51 %   Neutro Abs 3.6  1.7 - 7.7 K/uL   Lymphocytes Relative 38 %   Lymphs Abs 2.7 0.7 - 4.0 K/uL   Monocytes Relative 6 %   Monocytes Absolute 0.4 0.1 - 1.0 K/uL   Eosinophils Relative 4 %   Eosinophils Absolute 0.3 0.0 - 0.5 K/uL   Basophils Relative 1 %   Basophils Absolute 0.1 0.0 - 0.1 K/uL   Immature Granulocytes 0 %   Abs Immature Granulocytes 0.02  0.00 - 0.07 K/uL   No results found.  ED Clinical Impression  1. Anterior cervical lymphadenopathy      ED Assessment/Plan     Patient presents with isolated cervical lymphadenopathy.  CBC with differential normal.  No leukocytosis concerning for infection or malignancy.  No evidence of parotitis, Silaloadenitis.  Doubt RPA, epiglottitis, peritonsillar abscess.  COVID, flu, RSV, strep negative.  Will have patient apply warm compresses, take Tylenol 1000 mg 3 times a day as needed for pain, follow-up with ENT if not better in 7 to 10 days to evaluate for malignancy, especially given remote history of smoking.  ER return precautions given to the patient and wife.  Discussed labs, MDM, treatment plan, and plan for follow-up with family member and patient. Discussed sn/sx that should prompt return to the ED. They agree with plan.   No orders of the defined types were placed in this encounter.     *This clinic note was created using Dragon dictation software. Therefore, there may be occasional mistakes despite careful proofreading.  ?    Melynda Ripple, MD 08/14/22 [phone]

## 2022-08-13 NOTE — Discharge Instructions (Signed)
CBC is normal, COVID, flu, RSV and strep came back negative.  This could be a reactive lymph node due to a viral process.  I do not think that you are contagious.  Warm compresses, Tylenol 1000 mg 3 times a day as needed for pain, salt water gargles for raspy voice.  Follow-up with [facility] if you do not get better in a week to 10 days.  Go to the ER for the signs and symptoms we discussed

## 2022-11-04 ENCOUNTER — Other Ambulatory Visit: Payer: Medicare Other

## 2022-11-04 DIAGNOSIS — R972 Elevated prostate specific antigen [PSA]: Secondary | ICD-10-CM

## 2022-11-05 LAB — PSA TOTAL (REFLEX TO FREE): Prostate Specific Ag, Serum: 5.7 ng/mL — ABNORMAL HIGH (ref 0.0–4.0)

## 2022-11-05 LAB — FPSA% REFLEX
% FREE PSA: 16.1 %
PSA, FREE: 0.92 ng/mL

## 2022-11-06 ENCOUNTER — Ambulatory Visit: Payer: Medicare Other | Admitting: Urology

## 2022-11-06 ENCOUNTER — Encounter: Payer: Self-pay | Admitting: Urology

## 2022-11-06 VITALS — BP 155/80 | HR 75 | Ht 73.0 in | Wt 237.0 lb

## 2022-11-06 DIAGNOSIS — N3281 Overactive bladder: Secondary | ICD-10-CM

## 2022-11-06 DIAGNOSIS — N138 Other obstructive and reflux uropathy: Secondary | ICD-10-CM

## 2022-11-06 DIAGNOSIS — R972 Elevated prostate specific antigen [PSA]: Secondary | ICD-10-CM | POA: Diagnosis not present

## 2022-11-06 DIAGNOSIS — N401 Enlarged prostate with lower urinary tract symptoms: Secondary | ICD-10-CM | POA: Diagnosis not present

## 2022-11-06 DIAGNOSIS — Z125 Encounter for screening for malignant neoplasm of prostate: Secondary | ICD-10-CM

## 2022-11-06 LAB — BLADDER SCAN AMB NON-IMAGING

## 2022-11-06 MED ORDER — OXYBUTYNIN CHLORIDE ER 10 MG PO TB24: 10.0000 mg | ORAL_TABLET | Freq: Every day | ORAL | 6 refills | Status: DC

## 2022-11-06 NOTE — Progress Notes (Signed)
   11/06/2022 10:09 AM   John Strong [birth year] [id number]  Reason for visit: Follow up elevated PSA, BPH/OAB, history of gross hematuria  HPI: 77 year old male who I follow for the above issues.  He has a family history of lethal prostate cancer in his father.  He has had a slowly increasing PSA over the last few years including 4.97 in October 2022, 5.21 in February 2023,  7.3(10.7% free) in September 2023.  Also had CT for hematuria work-up and prostate measured 65 g, equating to a reassuring PSA density of 0.11.  We reviewed the AUA guidelines regarding PSA screening, and that routine screening is not recommended in men over age 24.  We also reviewed that typically a normal PSA would be considered less than 6.5 for men in his mid 80s, and reviewed the reassuring PSA density.    Using shared decision making he opted for a repeat PSA in 6 months.  Fortunately this decreased back down to 5.7 from March 2024, which essentially is normal for his age, as well as a reassuring PSA density of 0.09.  We again reviewed the AUA guidelines that do not recommend routine screening in men over age 12, and he was amenable to discontinuing PSA screening per those recommendations.  We previously have discussed ED as well, and he has now failed a trial of both max dose Cialis and Viagra.  His wife has some other health issues and he is not interested in other ED treatment options at this time.  He has a history of a negative gross hematuria workup in June 2023, suspect BPH as etiology of his gross hematuria.  At our visit in September 2023 he reported some weak urinary stream, straining, sensation of incomplete emptying, nocturia 2x overnight, and we trialed Flomax.  He denies any major improvements on this medication.  Continues to report nocturia 2-3 times at night, as well as some urgency and frequency.  He drinks primarily water during the day.  We reviewed options including trial of an OAB medication like  oxybutynin.  He was interested in starting with some behavioral strategies first, but requested the medication be sent in in case he does not have improvement over the next few weeks.  Denies any recurrent gross hematuria.  Trial of oxybutynin 10 mg XL for urinary symptoms No improvement on Cialis or Viagra previously, not interested in further ED treatments No further PSA screening per guideline recommendations RTC 6 months symptom check and PVR  Billey Co, MD  [facility] [address] [place], [place] [address] [phone]

## 2023-05-20 ENCOUNTER — Ambulatory Visit: Payer: Medicare Other | Admitting: Urology

## 2023-05-20 ENCOUNTER — Encounter: Payer: Self-pay | Admitting: Urology

## 2023-05-20 VITALS — BP 166/81 | HR 69 | Ht 73.0 in | Wt 232.0 lb

## 2023-05-20 DIAGNOSIS — N138 Other obstructive and reflux uropathy: Secondary | ICD-10-CM

## 2023-05-20 DIAGNOSIS — N529 Male erectile dysfunction, unspecified: Secondary | ICD-10-CM

## 2023-05-20 DIAGNOSIS — R399 Unspecified symptoms and signs involving the genitourinary system: Secondary | ICD-10-CM | POA: Diagnosis not present

## 2023-05-20 DIAGNOSIS — N401 Enlarged prostate with lower urinary tract symptoms: Secondary | ICD-10-CM

## 2023-05-20 NOTE — Patient Instructions (Signed)

## 2023-05-20 NOTE — Progress Notes (Signed)
05/20/2023 10:49 AM   John Strong March 17, 1946 [id number]  Reason for visit: Follow up lower urinary tract symptoms, PSA screening, ED  HPI: 77 year old male we have followed for the above issues.  He has a family history of lethal prostate cancer in his father.  He has a history of a negative gross hematuria workup in June 2023, CT showed no abnormal urologic findings and prostate measured 65g, cystoscopy showed a moderately enlarged prostate with an elevated bladder neck.  He has a history of mildly elevated PSA up to 7.3 which decreased on repeat to 5.7 with 16% free.  His PSA density is reassuring at 0.09.  Using shared decision making he opted to defer further evaluation with biopsy or MRI and discontinue screening per the guideline recommendations.    He continues to have some mild urinary symptoms of a sensation of incomplete emptying and nocturia 2-3 times at night.  PVRs have been normal, including today.  He had no improvement previously on Flomax.  We have previously discussed a trial of a OAB medication like oxybutynin but he deferred.  He denies any major changes today, not bothered enough to consider additional trial of a medication or outlet procedures.  We discussed avoiding bladder irritants, minimizing fluids prior to bedtime, and double voiding.  Return precautions were discussed extensively.  Finally, in terms of ED, he previously has failed trials of max dose Cialis and Viagra.  His wife has some other medical issues, he is not interested in further ED treatments at this time.  RTC 1 year PVR  Sondra Come, MD  [facility] [address], [address] [place], [place] [address] [phone]

## 2023-09-18 ENCOUNTER — Emergency Department: Payer: Medicare Other

## 2023-09-18 ENCOUNTER — Other Ambulatory Visit: Payer: Self-pay

## 2023-09-18 ENCOUNTER — Emergency Department
Admission: EM | Admit: 2023-09-18 | Discharge: 2023-09-18 | Disposition: A | Discharge status: Home / Self Care | Payer: Medicare Other | Attending: Emergency Medicine | Admitting: Emergency Medicine

## 2023-09-18 DIAGNOSIS — E119 Type 2 diabetes mellitus without complications: Secondary | ICD-10-CM | POA: Insufficient documentation

## 2023-09-18 DIAGNOSIS — R42 Dizziness and giddiness: Secondary | ICD-10-CM | POA: Diagnosis present

## 2023-09-18 DIAGNOSIS — I10 Essential (primary) hypertension: Secondary | ICD-10-CM | POA: Insufficient documentation

## 2023-09-18 LAB — CBC
HCT: 40.8 % (ref 39.0–52.0)
Hemoglobin: 13.3 g/dL (ref 13.0–17.0)
MCH: 31.4 pg (ref 26.0–34.0)
MCHC: 32.6 g/dL (ref 30.0–36.0)
MCV: 96.5 fL (ref 80.0–100.0)
Platelets: 292 10*3/uL (ref 150–400)
RBC: 4.23 MIL/uL (ref 4.22–5.81)
RDW: 12.1 % (ref 11.5–15.5)
WBC: 8.7 10*3/uL (ref 4.0–10.5)
nRBC: 0 % (ref 0.0–0.2)

## 2023-09-18 LAB — BASIC METABOLIC PANEL
Anion gap: 12 (ref 5–15)
BUN: 12 mg/dL (ref 8–23)
CO2: 25 mmol/L (ref 22–32)
Calcium: 9.3 mg/dL (ref 8.9–10.3)
Chloride: 100 mmol/L (ref 98–111)
Creatinine, Ser: 0.64 mg/dL (ref 0.61–1.24)
GFR, Estimated: >60 mL/min (ref >60)
Glucose, Bld: 99 mg/dL (ref 70–99)
Potassium: 3.7 mmol/L (ref 3.5–5.1)
Sodium: 137 mmol/L (ref 135–145)

## 2023-09-18 LAB — TROPONIN I (HIGH SENSITIVITY): Troponin I (High Sensitivity): 3 ng/L (ref <18)

## 2023-09-18 MED ORDER — MECLIZINE HCL 12.5 MG PO TABS: 12.5000 mg | ORAL_TABLET | Freq: Three times a day (TID) | ORAL | 0 refills | Status: DC | PRN

## 2023-09-18 NOTE — ED Provider Notes (Signed)
[facility] Provider Note    Event Date/Time   First MD Initiated Contact with Patient 09/18/23 1609     (approximate)   History   Chief Complaint Dizziness   HPI  John Strong is a 78 y.o. male with past medical history of hypertension, hyperlipidemia, diabetes, and PAD who presents to the ED complaining of dizziness.  Patient reports that for the past 2 weeks he has been dealing with intermittent episodes of dizziness and feeling like the room spinning around him.  He states it typically occurs when he goes to stand up to go to the bathroom or lays down in bed at night.  He states he does not experience the symptoms every day or every time he stands up.  He reports feeling slightly lightheaded at times, but this does not get to the point that he feels like he is going to pass out.  He denies any chest pain or shortness of breath.  He has not had any vision changes, speech changes, numbness, or weakness.     Physical Exam   Triage Vital Signs: ED Triage Vitals  Encounter Vitals Group     BP 09/18/23 1352 (!) 140/77     Systolic BP Percentile --      Diastolic BP Percentile --      Pulse Rate 09/18/23 1352 70     Resp 09/18/23 1352 20     Temp 09/18/23 1352 97.9 F (36.6 C)     Temp Source 09/18/23 1352 Oral     SpO2 09/18/23 1352 100 %     Weight --      Height --      Head Circumference --      Peak Flow --      Pain Score 09/18/23 1353 0     Pain Loc --      Pain Education --      Exclude from Growth Chart --     Most recent vital signs: Vitals:   09/18/23 1352  BP: (!) 140/77  Pulse: 70  Resp: 20  Temp: 97.9 F (36.6 C)  SpO2: 100%    Constitutional: Alert and oriented. Eyes: Conjunctivae are normal. Head: Atraumatic. Nose: No congestion/rhinnorhea. Mouth/Throat: Mucous membranes are moist.  Cardiovascular: Normal rate, regular rhythm. Grossly normal heart sounds.  2+ radial pulses bilaterally. Respiratory: Normal respiratory  effort.  No retractions. Lungs CTAB. Gastrointestinal: Soft and nontender. No distention. Musculoskeletal: No lower extremity tenderness nor edema.  Neurologic:  Normal speech and language. No gross focal neurologic deficits are appreciated.    ED Results / Procedures / Treatments   Labs (all labs ordered are listed, but only abnormal results are displayed) Labs Reviewed  BASIC METABOLIC PANEL  CBC  TROPONIN I (HIGH SENSITIVITY)     EKG  ED ECG REPORT I, Chesley Noon, the attending physician, personally viewed and interpreted this ECG.   Date: 09/18/2023  EKG Time: 13:59  Rate: 77  Rhythm: normal sinus rhythm, isolated PVC  Axis: Normal  Intervals:none  ST&T Change: None  RADIOLOGY CT head reviewed and interpreted by me with no hemorrhage or midline shift.  PROCEDURES:  Critical Care performed: No  Procedures   MEDICATIONS ORDERED IN ED: Medications - No data to display   IMPRESSION / MDM / ASSESSMENT AND PLAN / ED COURSE  I reviewed the triage vital signs and the nursing notes.  78 y.o. male with past medical history of hypertension, hyperlipidemia, diabetes, and PAD who presents to the ED complaining of intermittent dizziness and feeling like the room is spinning around him for the past 2 weeks.  Patient's presentation is most consistent with acute presentation with potential threat to life or bodily function.  Differential diagnosis includes, but is not limited to, stroke, TIA, anemia, electrolyte abnormality, AKI, orthostatic hypotension, arrhythmia, peripheral vertigo.  Patient nontoxic-appearing and in no acute distress, vital signs are unremarkable.  EKG shows no evidence of arrhythmia or ischemia and troponin within normal limits, patient denies any chest pain or shortness of breath and I doubt cardiac etiology.  He does not have any focal neurologic deficits on exam and CT head is negative for acute process.  Stroke seems  unlikely as symptoms occur with changes in position and have been present intermittently for 2 weeks.  Labs show no significant anemia, leukocytosis, lecture abnormality, or AKI.  Patient is able to stand and ambulate here in the ED without difficulty, is appropriate for outpatient management with PCP follow-up.  Will prescribe meclizine for him to use as needed, he was counseled to return to the ED for new or worsening symptoms.  Patient and spouse agree with plan.      FINAL CLINICAL IMPRESSION(S) / ED DIAGNOSES   Final diagnoses:  Dizziness     Rx / DC Orders   ED Discharge Orders          Ordered    meclizine (ANTIVERT) 12.5 MG tablet  3 times daily PRN        09/18/23 1646             Note:  This document was prepared using Dragon voice recognition software and may include unintentional dictation errors.   Chesley Noon, MD 09/18/23 [phone]

## 2023-09-18 NOTE — ED Notes (Signed)
See triage notes. Patient was sent from [facility] due to an abnormal EKG. Patient c/o fatigue times one month and dizziness times one week.

## 2023-09-18 NOTE — ED Triage Notes (Signed)
Pt to ED via POV from [facility]. [facility] sent pt over due to abnormal EKG. Pt reports fatigue x1 month and dizziness x1 wk. Pt also reports increased hiccups and reflux. Pt denies blood thinners. Pt denies CP.

## 2024-03-09 ENCOUNTER — Encounter: Payer: Self-pay | Admitting: Urology

## 2024-05-17 ENCOUNTER — Ambulatory Visit: Admitting: Urology

## 2024-05-17 DIAGNOSIS — N138 Other obstructive and reflux uropathy: Secondary | ICD-10-CM

## 2024-05-18 ENCOUNTER — Ambulatory Visit: Payer: Self-pay | Admitting: Urology

## 2024-06-07 ENCOUNTER — Ambulatory Visit: Admitting: Urology

## 2024-06-07 VITALS — BP 164/75 | HR 73 | Wt 220.0 lb

## 2024-06-07 DIAGNOSIS — N3281 Overactive bladder: Secondary | ICD-10-CM

## 2024-06-07 DIAGNOSIS — Z125 Encounter for screening for malignant neoplasm of prostate: Secondary | ICD-10-CM | POA: Diagnosis not present

## 2024-06-07 DIAGNOSIS — N401 Enlarged prostate with lower urinary tract symptoms: Secondary | ICD-10-CM

## 2024-06-07 DIAGNOSIS — R399 Unspecified symptoms and signs involving the genitourinary system: Secondary | ICD-10-CM | POA: Diagnosis not present

## 2024-06-07 DIAGNOSIS — N138 Other obstructive and reflux uropathy: Secondary | ICD-10-CM | POA: Diagnosis not present

## 2024-06-07 LAB — BLADDER SCAN AMB NON-IMAGING

## 2024-06-07 MED ORDER — TROSPIUM CHLORIDE ER 60 MG PO CP24: 60.0000 mg | ORAL_CAPSULE | Freq: Every day | ORAL | 11 refills | Status: AC

## 2024-06-07 NOTE — Progress Notes (Signed)
   06/07/2024 10:52 AM   John Strong Feb 13, 1946 [id number]  Reason for visit: Follow up urinary symptoms, PSA screening, ED  History: Family history of lethal prostate cancer, screening discontinued based on age and low PSA density Long term bothersome urinary symptoms with frequency, urgency, sensation of incomplete emptying, previously did not respond to Flomax  or oxybutynin  from PCP ED, failed oral medications, not interested in more aggressive treatments  Physical Exam: BP (!) 164/75 (BP Location: Left Arm, Patient Position: Sitting, Cuff Size: Normal)   Pulse 73   Wt 220 lb (99.8 kg)   SpO2 98%   BMI 29.03 kg/m    Imaging/labs: PSA March 2024 5.7 which is decreased from 7.3 previously, very low PSA density of 0.09  Today: At some point was trialed on oxybutynin  in the last few months, unclear who prescribed this, no documentation, he does not think this was helpful Primary complaint continues to be urgency, nocturia 2-3 times at night, sensation of incomplete emptying Drinks only water and 1 to 2 cups of coffee during the day PVR is normal at 0ml  Plan:   Urinary symptoms: Unclear if this is BPH, OAB, normal aging.  No improvement on Flomax  or oxybutynin .  We discussed behavioral strategies, trial of trospium and risks and benefits discussed.  Need to have realistic expectations I think with his age ED: Failed oral medications, not interested in other treatments PSA screening: Discontinued per guideline recommendations of age, low PSA density Trial of trospium, RTC 8 weeks PVR and symptom check.  If persistently bothered by symptoms could consider urodynamics   Redell JAYSON Burnet, MD  [facility] [address] [place], [place] [address] [phone]

## 2024-07-11 ENCOUNTER — Encounter: Payer: Self-pay | Admitting: Urology

## 2024-08-02 ENCOUNTER — Ambulatory Visit: Admitting: Urology

## 2024-08-02 DIAGNOSIS — N138 Other obstructive and reflux uropathy: Secondary | ICD-10-CM

## 2024-09-07 ENCOUNTER — Ambulatory Visit: Admitting: Urology

## 2024-09-07 DIAGNOSIS — N138 Other obstructive and reflux uropathy: Secondary | ICD-10-CM

## 2024-09-08 ENCOUNTER — Encounter: Payer: Self-pay | Admitting: Urology

## 2024-09-09 ENCOUNTER — Emergency Department

## 2024-09-09 ENCOUNTER — Observation Stay
Admission: EM | Admit: 2024-09-09 | Discharge: 2024-09-11 | Disposition: A | Source: Ambulatory Visit | Attending: Student | Admitting: Student

## 2024-09-09 ENCOUNTER — Other Ambulatory Visit: Payer: Self-pay

## 2024-09-09 DIAGNOSIS — Z87891 Personal history of nicotine dependence: Secondary | ICD-10-CM | POA: Diagnosis not present

## 2024-09-09 DIAGNOSIS — R0602 Shortness of breath: Secondary | ICD-10-CM | POA: Insufficient documentation

## 2024-09-09 DIAGNOSIS — G459 Transient cerebral ischemic attack, unspecified: Secondary | ICD-10-CM | POA: Diagnosis present

## 2024-09-09 DIAGNOSIS — Z79899 Other long term (current) drug therapy: Secondary | ICD-10-CM | POA: Diagnosis not present

## 2024-09-09 DIAGNOSIS — E1151 Type 2 diabetes mellitus with diabetic peripheral angiopathy without gangrene: Secondary | ICD-10-CM | POA: Diagnosis not present

## 2024-09-09 DIAGNOSIS — E1140 Type 2 diabetes mellitus with diabetic neuropathy, unspecified: Secondary | ICD-10-CM | POA: Diagnosis not present

## 2024-09-09 DIAGNOSIS — E039 Hypothyroidism, unspecified: Secondary | ICD-10-CM | POA: Diagnosis present

## 2024-09-09 DIAGNOSIS — I6501 Occlusion and stenosis of right vertebral artery: Secondary | ICD-10-CM

## 2024-09-09 DIAGNOSIS — Z79890 Hormone replacement therapy (postmenopausal): Secondary | ICD-10-CM | POA: Insufficient documentation

## 2024-09-09 DIAGNOSIS — I739 Peripheral vascular disease, unspecified: Secondary | ICD-10-CM | POA: Diagnosis present

## 2024-09-09 DIAGNOSIS — R4701 Aphasia: Secondary | ICD-10-CM | POA: Diagnosis present

## 2024-09-09 DIAGNOSIS — R471 Dysarthria and anarthria: Principal | ICD-10-CM

## 2024-09-09 DIAGNOSIS — I10 Essential (primary) hypertension: Secondary | ICD-10-CM | POA: Diagnosis not present

## 2024-09-09 LAB — CBG MONITORING, ED: Glucose-Capillary: 131 mg/dL — ABNORMAL HIGH (ref 70–99)

## 2024-09-09 LAB — COMPREHENSIVE METABOLIC PANEL WITH GFR
ALT: 17 U/L (ref 0–44)
AST: 22 U/L (ref 15–41)
Albumin: 4.2 g/dL (ref 3.5–5.0)
Alkaline Phosphatase: 80 U/L (ref 38–126)
Anion gap: 11 (ref 5–15)
BUN: 12 mg/dL (ref 8–23)
CO2: 27 mmol/L (ref 22–32)
Calcium: 10.1 mg/dL (ref 8.9–10.3)
Chloride: 99 mmol/L (ref 98–111)
Creatinine, Ser: 0.88 mg/dL (ref 0.61–1.24)
GFR, Estimated: >60 mL/min
Glucose, Bld: 121 mg/dL — ABNORMAL HIGH (ref 70–99)
Potassium: 3.7 mmol/L (ref 3.5–5.1)
Sodium: 136 mmol/L (ref 135–145)
Total Bilirubin: 0.3 mg/dL (ref 0.0–1.2)
Total Protein: 8.2 g/dL — ABNORMAL HIGH (ref 6.5–8.1)

## 2024-09-09 LAB — PROTIME-INR
INR: 1 (ref 0.8–1.2)
Prothrombin Time: 14.2 s (ref 11.4–15.2)

## 2024-09-09 LAB — CBC WITH DIFFERENTIAL/PLATELET
Abs Immature Granulocytes: 0.02 K/uL (ref 0.00–0.07)
Basophils Absolute: 0 K/uL (ref 0.0–0.1)
Basophils Relative: 1 %
Eosinophils Absolute: 0.2 K/uL (ref 0.0–0.5)
Eosinophils Relative: 3 %
HCT: 41 % (ref 39.0–52.0)
Hemoglobin: 13.5 g/dL (ref 13.0–17.0)
Immature Granulocytes: 0 %
Lymphocytes Relative: 28 %
Lymphs Abs: 2.2 K/uL (ref 0.7–4.0)
MCH: 30.8 pg (ref 26.0–34.0)
MCHC: 32.9 g/dL (ref 30.0–36.0)
MCV: 93.6 fL (ref 80.0–100.0)
Monocytes Absolute: 0.5 K/uL (ref 0.1–1.0)
Monocytes Relative: 6 %
Neutro Abs: 4.8 K/uL (ref 1.7–7.7)
Neutrophils Relative %: 62 %
Platelets: 258 K/uL (ref 150–400)
RBC: 4.38 MIL/uL (ref 4.22–5.81)
RDW: 11.9 % (ref 11.5–15.5)
WBC: 7.8 K/uL (ref 4.0–10.5)
nRBC: 0 % (ref 0.0–0.2)

## 2024-09-09 LAB — TROPONIN T, HIGH SENSITIVITY
Troponin T High Sensitivity: 18 ng/L (ref 0–19)
Troponin T High Sensitivity: 21 ng/L — ABNORMAL HIGH (ref 0–19)

## 2024-09-09 LAB — APTT: aPTT: 31 s (ref 24–36)

## 2024-09-09 LAB — SEDIMENTATION RATE: Sed Rate: 49 mm/h — ABNORMAL HIGH (ref 0–20)

## 2024-09-09 MED ORDER — GABAPENTIN 300 MG PO CAPS: 300.0000 mg | ORAL_CAPSULE | ORAL | Status: DC

## 2024-09-09 MED ORDER — STROKE: EARLY STAGES OF RECOVERY BOOK: Freq: Once | Status: DC

## 2024-09-09 MED ORDER — SENNOSIDES-DOCUSATE SODIUM 8.6-50 MG PO TABS: 1.0000 | ORAL_TABLET | Freq: Every evening | ORAL | Status: DC | PRN

## 2024-09-09 MED ORDER — ENOXAPARIN SODIUM 40 MG/0.4ML IJ SOSY: 40.0000 mg | PREFILLED_SYRINGE | INTRAMUSCULAR | Status: DC

## 2024-09-09 MED ORDER — LEVOTHYROXINE SODIUM 112 MCG PO TABS
112.0000 ug | ORAL_TABLET | Freq: Every day | ORAL | Status: DC
  Administered 2024-09-11: 112 ug via ORAL
  Filled 2024-09-09 (×3): qty 1

## 2024-09-09 MED ORDER — ACETAMINOPHEN 160 MG/5ML PO SOLN: 650.0000 mg | ORAL | Status: DC | PRN

## 2024-09-09 MED ORDER — SODIUM CHLORIDE 0.9 % IV SOLN: INTRAVENOUS | Status: AC

## 2024-09-09 MED ORDER — CLOPIDOGREL BISULFATE 75 MG PO TABS
75.0000 mg | ORAL_TABLET | Freq: Every day | ORAL | Status: DC
  Administered 2024-09-09 – 2024-09-11 (×3): 75 mg via ORAL
  Filled 2024-09-09 (×3): qty 1

## 2024-09-09 MED ORDER — GABAPENTIN 300 MG PO CAPS
300.0000 mg | ORAL_CAPSULE | Freq: Every morning | ORAL | Status: DC
  Administered 2024-09-10 – 2024-09-11 (×2): 300 mg via ORAL
  Filled 2024-09-09 (×2): qty 1

## 2024-09-09 MED ORDER — ACETAMINOPHEN 650 MG RE SUPP: 650.0000 mg | RECTAL | Status: DC | PRN

## 2024-09-09 MED ORDER — IOHEXOL 350 MG/ML SOLN
100.0000 mL | Freq: Once | INTRAVENOUS | Status: AC | PRN
  Administered 2024-09-09: 100 mL via INTRAVENOUS

## 2024-09-09 MED ORDER — INSULIN ASPART 100 UNIT/ML IJ SOLN: 0.0000 [IU] | Freq: Every day | INTRAMUSCULAR | Status: DC

## 2024-09-09 MED ORDER — GABAPENTIN 300 MG PO CAPS
600.0000 mg | ORAL_CAPSULE | Freq: Every day | ORAL | Status: DC
  Administered 2024-09-09 – 2024-09-10 (×2): 600 mg via ORAL
  Filled 2024-09-09 (×2): qty 2

## 2024-09-09 MED ORDER — ROSUVASTATIN CALCIUM 10 MG PO TABS
10.0000 mg | ORAL_TABLET | Freq: Every day | ORAL | Status: DC
  Administered 2024-09-09 – 2024-09-10 (×2): 10 mg via ORAL
  Filled 2024-09-09 (×2): qty 1

## 2024-09-09 MED ORDER — TETRACAINE HCL 0.5 % OP SOLN
2.0000 [drp] | Freq: Once | OPHTHALMIC | Status: AC
  Administered 2024-09-09: 2 [drp] via OPHTHALMIC
  Filled 2024-09-09: qty 4

## 2024-09-09 MED ORDER — INSULIN ASPART 100 UNIT/ML IJ SOLN: 0.0000 [IU] | Freq: Three times a day (TID) | INTRAMUSCULAR | Status: DC

## 2024-09-09 MED ORDER — ASPIRIN 81 MG PO TBEC
81.0000 mg | DELAYED_RELEASE_TABLET | Freq: Every day | ORAL | Status: DC
  Administered 2024-09-09 – 2024-09-11 (×3): 81 mg via ORAL
  Filled 2024-09-09 (×3): qty 1

## 2024-09-09 MED ORDER — ACETAMINOPHEN 325 MG PO TABS: 650.0000 mg | ORAL_TABLET | ORAL | Status: DC | PRN

## 2024-09-09 MED ORDER — ENOXAPARIN SODIUM 40 MG/0.4ML IJ SOSY
40.0000 mg | PREFILLED_SYRINGE | INTRAMUSCULAR | Status: DC
  Administered 2024-09-10 – 2024-09-11 (×2): 40 mg via SUBCUTANEOUS
  Filled 2024-09-09 (×2): qty 0.4

## 2024-09-09 NOTE — ED Triage Notes (Signed)
 First nurse note: pt to ED from [facility] for slurred speech, altered balance x 1 month. ambulatory

## 2024-09-09 NOTE — Assessment & Plan Note (Signed)
 Sliding scale insulin  coverage

## 2024-09-09 NOTE — Assessment & Plan Note (Addendum)
 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  Prn Labetalol IV or Vasotec IV If BP greater than 220/120  Statins for LDL goal less than 70, continue on Crestor  ASA 81mg  daily, Plavix  75mg  daily x 3 weeks then monotherapy thereafter Telemetry, echocardiogram Avoid dextrose  containing fluids, Maintain euglycemia, euthermia Neuro checks q4 hrs x 24 hrs and then per shift Head of bed 30 degrees Physical therapy/Occupational therapy/Speech therapy if failed dysphagia screen For neurology consult in the a.m.

## 2024-09-09 NOTE — Assessment & Plan Note (Signed)
 Continuing rosuvastatin  and aspirin 

## 2024-09-09 NOTE — H&P (Signed)
 " History and Physical    Patient: John Strong FMW:[id number] DOB: 1946-01-16 DOA: 09/09/2024 DOS: the patient was seen and examined on 09/09/2024 PCP: Alla Amis, MD  Patient coming from: Home  Chief Complaint:  Chief Complaint  Patient presents with   Aphasia    HPI: John Strong is a 79 y.o. male with medical history significant for DM, HTN, BPH, PAD, hypothyroidism, being admitted for TIA workup.  He presents to the ED with a 3-week history of dysarthria.  This was preceded by a several week history of dizziness and imbalance for which she saw his PCP on 12/29 when he reported that the dizziness is mostly on looking up and down.  He does have a history of vertigo.  He was prescribed meclizine  at the time.  His dysarthria is more recent.  It is intermittent and and to him seems unrelated to the episodes of dizziness.  He again presented to PCP today and was sent to the ED for evaluation. In the ED BP 150/87 with otherwise normal vitals.Will  Labs including CBC, CMP and troponin were all unremarkable EKG showed NSR at 78 MRI brain was normal for age with no acute findings however CTA head and neck showed a high-grade stenosis in the proximal right vertebral artery. The ED provider called and spoke with neurologist, Dr. Matthews who recommended admission for stroke workup. Hospitalist consulted for admission.      Past Medical History:  Diagnosis Date   Arthritis    Diabetes mellitus without complication (HCC)    GERD (gastroesophageal reflux disease)    Hyperlipidemia    Hypertension    Thyroid disease    Past Surgical History:  Procedure Laterality Date   LUMBAR LAMINECTOMY/DECOMPRESSION MICRODISCECTOMY N/A 11/14/2020   Procedure: L2-5 MINIMALLY INVASIVE DECOMPRESSION;  Surgeon: Clois Fret, MD;  Location: [facility] ORS;  Service: Neurosurgery;  Laterality: N/A;   THYROIDECTOMY     Social History:  reports that he has quit smoking. His smoking use included cigarettes. He  has been exposed to tobacco smoke. He has never used smokeless tobacco. He reports that he does not currently use alcohol. He reports current drug use. Drugs: Cocaine and Marijuana.  Allergies[1]  Family History  Problem Relation Age of Onset   Diabetes Mother    Colon cancer Father     Prior to Admission medications  Medication Sig Start Date End Date Taking? Authorizing Provider  carvedilol (COREG) 3.125 MG tablet Take by mouth. 10/07/21 06/07/24  [provider]  gabapentin  (NEURONTIN ) 300 MG capsule Take 300-600 mg by mouth See admin instructions. Tale 300 mg in the morning and 600 mg at bedtime 05/16/20   [provider]  hydrochlorothiazide  (HYDRODIURIL ) 25 MG tablet Take 1 tablet by mouth daily. 10/09/21   [provider]  levothyroxine  (SYNTHROID ) 112 MCG tablet Take 112 mcg by mouth daily. 01/19/19   [provider]  meclizine  (ANTIVERT ) 12.5 MG tablet Take 1 tablet (12.5 mg total) by mouth 3 (three) times daily as needed for dizziness. Patient not taking: Reported on 06/07/2024 09/18/23   Willo Dunnings, MD  metFORMIN  (GLUCOPHAGE ) 500 MG tablet Take 500 mg by mouth 2 (two) times daily. 03/20/20   [provider]  NIFEdipine  (ADALAT  CC) 30 MG 24 hr tablet Take 30 mg by mouth 2 (two) times daily. 04/11/20   [provider]  omeprazole (PRILOSEC) 20 MG capsule Take 20 mg by mouth daily. 11/02/19   [provider]  rosuvastatin  (CRESTOR ) 10 MG tablet Take 10 mg  by mouth at bedtime. 08/27/20   [provider]  Trospium  Chloride 60 MG CP24 Take 1 capsule (60 mg total) by mouth daily. 06/07/24   Francisca Redell BROCKS, MD    Physical Exam: Vitals:   09/09/24 1845 09/09/24 1900 09/09/24 1944 09/09/24 2000  BP:  (!) 147/86  (!) 144/94  Pulse:  67  70  Resp:  16  17  Temp: 98 F (36.7 C) 97.9 F (36.6 C)    TempSrc:  Oral    SpO2:  96%  98%  Weight:   103.6 kg   Height:   6' 2 (1.88 m)    Physical Exam Vitals and  nursing note reviewed.  Constitutional:      General: He is not in acute distress. HENT:     Head: Normocephalic and atraumatic.  Cardiovascular:     Rate and Rhythm: Normal rate and regular rhythm.     Heart sounds: Normal heart sounds.  Pulmonary:     Effort: Pulmonary effort is normal.     Breath sounds: Normal breath sounds.  Abdominal:     Palpations: Abdomen is soft.     Tenderness: There is no abdominal tenderness.  Neurological:     Mental Status: Mental status is at baseline.     Labs on Admission: I have personally reviewed following labs and imaging studies  CBC: Recent Labs  Lab 09/09/24 1400  WBC 7.8  NEUTROABS 4.8  HGB 13.5  HCT 41.0  MCV 93.6  PLT 258   Basic Metabolic Panel: Recent Labs  Lab 09/09/24 1400  NA 136  K 3.7  CL 99  CO2 27  GLUCOSE 121*  BUN 12  CREATININE 0.88  CALCIUM  10.1   GFR: Estimated Creatinine Clearance: 88.9 mL/min (by C-G formula based on SCr of 0.88 mg/dL). Liver Function Tests: Recent Labs  Lab 09/09/24 1400  AST 22  ALT 17  ALKPHOS 80  BILITOT 0.3  PROT 8.2*  ALBUMIN 4.2   No results for input(s): LIPASE, AMYLASE in the last 168 hours. No results for input(s): AMMONIA in the last 168 hours. Coagulation Profile: Recent Labs  Lab 09/09/24 1400  INR 1.0   Cardiac Enzymes: No results for input(s): CKTOTAL, CKMB, CKMBINDEX, TROPONINI in the last 168 hours. BNP (last 3 results) No results for input(s): PROBNP in the last 8760 hours. HbA1C: No results for input(s): HGBA1C in the last 72 hours. CBG: No results for input(s): GLUCAP in the last 168 hours. Lipid Profile: No results for input(s): CHOL, HDL, LDLCALC, TRIG, CHOLHDL, LDLDIRECT in the last 72 hours. Thyroid Function Tests: No results for input(s): TSH, T4TOTAL, FREET4, T3FREE, THYROIDAB in the last 72 hours. Anemia Panel: No results for input(s): VITAMINB12, FOLATE, FERRITIN, TIBC, IRON,  RETICCTPCT in the last 72 hours. Urine analysis:    Component Value Date/Time   COLORURINE YELLOW (A) 11/02/2020 1012   APPEARANCEUR Clear 02/12/2022 0919   LABSPEC 1.023 11/02/2020 1012   PHURINE 5.0 11/02/2020 1012   GLUCOSEU Negative 02/12/2022 0919   HGBUR NEGATIVE 11/02/2020 1012   BILIRUBINUR Negative 02/12/2022 0919   KETONESUR NEGATIVE 11/02/2020 1012   PROTEINUR Negative 02/12/2022 0919   PROTEINUR NEGATIVE 11/02/2020 1012   NITRITE Negative 02/12/2022 0919   NITRITE NEGATIVE 11/02/2020 1012   LEUKOCYTESUR Negative 02/12/2022 0919   LEUKOCYTESUR NEGATIVE 11/02/2020 1012    Radiological Exams on Admission: CT ANGIO HEAD NECK W WO CM Result Date: 09/09/2024 EXAM: CT HEAD WITHOUT CTA HEAD AND NECK WITH AND WITHOUT 09/09/2024 06:08:44  PM TECHNIQUE: CTA of the head and neck was performed with and without the administration of 100 mL of intravenous contrast (iohexol  (OMNIPAQUE ) 350 MG/ML injection). Noncontrast CT of the head with reconstructed 2-D images are also provided for review. Multiplanar 2D and/or 3D reformatted images are provided for review. Automated exposure control, iterative reconstruction, and/or weight based adjustment of the mA/kV was utilized to reduce the radiation dose to as low as reasonably achievable. COMPARISON: MR head without contrast 09/09/2024 and CT head without contrast 09/09/2024 CLINICAL HISTORY: Neuro deficit, acute, stroke suspected. Aphasia. FINDINGS: CT HEAD: BRAIN AND VENTRICLES: No acute intracranial hemorrhage. No mass effect. No midline shift. No extra-axial fluid collection. No evidence of acute infarct. No hydrocephalus. Mild atrophy and white matter changes are stable, likely within normal limits for age. ORBITS: No acute abnormality. SINUSES AND MASTOIDS: No acute abnormality. CTA NECK: AORTIC ARCH AND ARCH VESSELS: No dissection or arterial injury. No significant stenosis of the brachiocephalic or subclavian arteries. CERVICAL CAROTID ARTERIES:  Motion somewhat obscures detail of the common carotid artery just below the bifurcation bilaterally. Mild atherosclerotic changes are present at both carotid bifurcations without significant stenoses relative to the more distal vessels. No dissection or arterial injury. CERVICAL VERTEBRAL ARTERIES: The vertebral arteries are codominant. A high grade stenosis is present in the proximal right vertebral artery. No dissection or arterial injury. LUNGS AND MEDIASTINUM: Unremarkable. SOFT TISSUES: No acute abnormality. BONES: No acute abnormality. CTA HEAD: ANTERIOR CIRCULATION: Minimal atherosclerotic changes are present at the supraclinoid ICA without significant stenosis. The right A1 segment is dominant. The anterior communicating artery is patent. No significant stenosis of the middle cerebral arteries. No aneurysm. POSTERIOR CIRCULATION: No significant stenosis of the posterior cerebral arteries. No significant stenosis of the basilar artery. A high grade stenosis is present in the proximal right vertebral artery. No aneurysm. OTHER: No dural venous sinus thrombosis on this non-dedicated study. IMPRESSION: 1. No acute intracranial abnormality. 2. 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 3. No large vessel effusion or other abnormalities detected significant stenosis. Electronically signed by: Lonni Necessary MD 09/09/2024 07:03 PM EST RP Workstation: [id number]   MR BRAIN WO CONTRAST Result Date: 09/09/2024 EXAM: MRI BRAIN WITHOUT CONTRAST 09/09/2024 05:54:00 PM TECHNIQUE: Multiplanar multisequence MRI of the head/brain was performed without the administration of intravenous contrast. COMPARISON: CT head without contrast 09/09/2024. CLINICAL HISTORY: Headache, neuro deficit. Intermittent slurred speech and abnormal balance over the last month. FINDINGS: BRAIN AND VENTRICLES: No acute infarct. No intracranial hemorrhage. No mass. No midline shift. No hydrocephalus. The sella is unremarkable.  Normal flow voids. Mild generalized atrophy and white matter changes, likely within normal limits for age. ORBITS: No significant abnormality. SINUSES AND MASTOIDS: No significant abnormality. BONES AND SOFT TISSUES: Normal marrow signal. No soft tissue abnormality. IMPRESSION: 1. No acute infarct, intracranial hemorrhage, mass, or midline shift. Normal MRI of the brain for age . Electronically signed by: lonni necessary MD 09/09/2024 06:43 PM EST RP Workstation: [id number]   CT HEAD WO CONTRAST Result Date: 09/09/2024 EXAM: CT HEAD WITHOUT CONTRAST 09/09/2024 02:49:21 PM TECHNIQUE: CT of the head was performed without the administration of intravenous contrast. Automated exposure control, iterative reconstruction, and/or weight based adjustment of the mA/kV was utilized to reduce the radiation dose to as low as reasonably achievable. COMPARISON: CT head 09/18/2023 CLINICAL HISTORY: Neuro deficit, acute, stroke suspected FINDINGS: BRAIN AND VENTRICLES: No acute hemorrhage. No evidence of acute infarct. No hydrocephalus. No extra-axial collection. No mass effect or midline shift. ORBITS: No  acute abnormality. SINUSES: No acute abnormality. SOFT TISSUES AND SKULL: No acute soft tissue abnormality. No skull fracture. IMPRESSION: 1. No acute intracranial abnormality. Electronically signed by: Glendia Molt MD 09/09/2024 02:52 PM EST RP Workstation: [id number]   Data Reviewed for HPI: Relevant notes from primary care and specialist visits, past discharge summaries as available in EHR, including Care Everywhere. Prior diagnostic testing as pertinent to current admission diagnoses Updated medications and problem lists for reconciliation ED course, including vitals, labs, imaging, treatment and response to treatment Triage notes, nursing and pharmacy notes and ED provider's notes Notable results as noted above in HPI      Assessment and Plan: * TIA (transient ischemic attack) Vertebral artery stenosis,  right, without infarction Symptoms started weeks prior and have been intermittent Will still allow permissive hypertension given stuttering symptoms.  Prn Labetalol IV or Vasotec IV If BP greater than 220/120  Statins for LDL goal less than 70, continue on Crestor  ASA 81mg  daily, Plavix  75mg  daily x 3 weeks then monotherapy thereafter Telemetry, echocardiogram Avoid dextrose  containing fluids, Maintain euglycemia, euthermia Neuro checks q4 hrs x 24 hrs and then per shift Head of bed 30 degrees Physical therapy/Occupational therapy/Speech therapy if failed dysphagia screen For neurology consult in the a.m.   Type 2 diabetes mellitus with diabetic neuropathy, without long-term current use of insulin  (HCC) Sliding scale insulin  coverage  Essential hypertension Hold antihypertensives for permissive hypertension, given stuttering neurologic symptoms  Acquired hypothyroidism Continue levothyroxine   PAD (peripheral artery disease) Continuing rosuvastatin  and aspirin      DVT prophylaxis: Lovenox   Consults: none  Advance Care Planning:   Code Status: Full Code   Family Communication: none  Disposition Plan: Back to previous home environment  Severity of Illness: The appropriate patient status for this patient is OBSERVATION. Observation status is judged to be reasonable and necessary in order to provide the required intensity of service to ensure the patient's safety. The patient's presenting symptoms, physical exam findings, and initial radiographic and laboratory data in the context of their medical condition is felt to place them at decreased risk for further clinical deterioration. Furthermore, it is anticipated that the patient will be medically stable for discharge from the hospital within 2 midnights of admission.   Author: Delayne LULLA Solian, MD 09/09/2024 8:52 PM  For on call review [email or web address].      [1]  Allergies Allergen Reactions   Lisinopril     Other  reaction(s): Angioedema   "

## 2024-09-09 NOTE — ED Provider Triage Note (Signed)
 Emergency Medicine Provider Triage Evaluation Note  John Strong , a 79 y.o. male  was evaluated in triage.  Pt complains of intermittent slurred speech, feeling off balance, weakness. Has had symptoms over the past month. Symptoms have been intermittent but each time they come back they are worse. He did have symptoms today that began this morning around 8-9, wife reports he has not returned to normal, he is slow and sluggish. Patient denies this.  Review of Systems  Positive: Weakness,  Negative: Nausea, vomiting, pain, CP, SOB  Physical Exam  There were no vitals taken for this visit. Gen:   Awake, no distress   Resp:  Normal effort  MSK:   Moves extremities without difficulty  Other:    Medical Decision Making  Medically screening exam initiated at 1:56 PM.  Appropriate orders placed.  John Strong was informed that the remainder of the evaluation will be completed by another provider, this initial triage assessment does not replace that evaluation, and the importance of remaining in the ED until their evaluation is complete.    Cleaster Tinnie LABOR, PA-C 09/09/24 1400

## 2024-09-09 NOTE — Progress Notes (Signed)
 Concerns for acute CVA.  Escorted to [facility] ER for evaluation

## 2024-09-09 NOTE — ED Provider Notes (Signed)
 "  [facility] Provider Note    Event Date/Time   First MD Initiated Contact with Patient 09/09/24 1631     (approximate)   History   Aphasia   HPI  John Strong is a 79 y.o. male who came in for multiple symptoms.  Patient has had slurred speech off balance left eye pressure intermittently for a month.  Patient's last symptoms were around 830 but have since resolved.    That he will have this intermittent dizziness is more just with standing and these intermittent issues with speech.  He reports that right now he has the symptoms and that they have not gone away fully but much improved.  He reports the last time he felt like his normal self with any symtpoms was 3 to 4 weeks ago.  He denies any chest pain, shortness of breath, abdominal pain or any other concerns.     Physical Exam   Triage Vital Signs: ED Triage Vitals [09/09/24 1359]  Encounter Vitals Group     BP (!) 150/87     Girls Systolic BP Percentile      Girls Diastolic BP Percentile      Boys Systolic BP Percentile      Boys Diastolic BP Percentile      Pulse Rate 83     Resp 18     Temp 98 F (36.7 C)     Temp Source Oral     SpO2 98 %     Weight      Height      Head Circumference      Peak Flow      Pain Score 0     Pain Loc      Pain Education      Exclude from Growth Chart     Most recent vital signs: Vitals:   09/09/24 1359  BP: (!) 150/87  Pulse: 83  Resp: 18  Temp: 98 F (36.7 C)  SpO2: 98%     General: Awake, no distress.  CV:  Good peripheral perfusion.  Resp:  Normal effort.  Abd:  No distention.  Other:  CN Nerves appear intact equal strength in arms and legs.  Finger-to-nose intact.  Patient's pressure in his eye is 17, recheck was 18.  Extraocular movements are intact. TM clear bilaterally.  ED Results / Procedures / Treatments   Labs (all labs ordered are listed, but only abnormal results are displayed) Labs Reviewed  COMPREHENSIVE METABOLIC PANEL  WITH GFR - Abnormal; Notable for the following components:      Result Value   Glucose, Bld 121 (*)    Total Protein 8.2 (*)    All other components within normal limits  PROTIME-INR  APTT  CBC WITH DIFFERENTIAL/PLATELET  CBC  CBG MONITORING, ED     EKG  My interpretation of EKG:  Normal sinus rate of 78 without any ST elevation, T wave version lead III, normal interval  RADIOLOGY I have reviewed the ct personally and interpreted no evidence intracranial hemorrhage   PROCEDURES:  Critical Care performed: No  Procedures   MEDICATIONS ORDERED IN ED: Medications - No data to display   IMPRESSION / MDM / ASSESSMENT AND PLAN / ED COURSE  I reviewed the triage vital signs and the nursing notes.   Patient's presentation is most consistent with acute presentation with potential threat to life or bodily function.   Patient comes in with concerns for intermittent neurological symptoms.  Difficult to tell from  patient if they truly are intermittent as then he says that he last felt normal 3 to 4 weeks ago or if they are just kind of waxing and waning with some mild symptoms at baseline.  I proceeded with CTA, MRI to further evaluate.  I did check the pressures in his eye and do not see evidence of glaucoma.  His pupil was nice and reactive.  Extraocular movements were intact.  I did order an ESR to make sure no signs of temporal arteritis.  Stroke code was not called as this is out of the 24-hour window from onset of symptoms.  CBC reassuring coags reassuring CMP reassuring  CT head is negative  MRI was negative but CTA  IMPRESSION: 1. No acute intracranial abnormality. 2. 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 3. No large vessel effusion or other abnormalities detected significant stenosis.  Discussed with Dr. Matthews who did recommend admission for TIA workup and then she would evaluate patient tomorrow.  The patient is on the cardiac monitor to  evaluate for evidence of arrhythmia and/or significant heart rate changes.      FINAL CLINICAL IMPRESSION(S) / ED DIAGNOSES   Final diagnoses:  Dysarthria     Rx / DC Orders   ED Discharge Orders     None        Note:  This document was prepared using Dragon voice recognition software and may include unintentional dictation errors.   Ernest Ronal BRAVO, MD 09/09/24 1928  "

## 2024-09-09 NOTE — Assessment & Plan Note (Signed)
 Hold antihypertensives for permissive hypertension, given stuttering neurologic symptoms

## 2024-09-09 NOTE — Assessment & Plan Note (Signed)
 Continue levothyroxine 

## 2024-09-09 NOTE — ED Triage Notes (Addendum)
 Pt to ED via POV from [facility]. Pt and wife reports slurred speech, off balance, left eye pressure that has been intermittent xt 12month. Wife reports symptoms will resolved and each time they come back work. Pt also endorses HA. Last symptoms today around 0830 but has resolved. Pt is back at baseline but seems more sluggish per wife. PA in triage for MSE.

## 2024-09-09 NOTE — Hospital Course (Signed)
 John Strong

## 2024-09-09 NOTE — ED Notes (Signed)
Patient is in imaging

## 2024-09-10 ENCOUNTER — Observation Stay: Admit: 2024-09-10 | Discharge: 2024-09-10 | Disposition: A | Attending: Internal Medicine

## 2024-09-10 DIAGNOSIS — R9431 Abnormal electrocardiogram [ECG] [EKG]: Secondary | ICD-10-CM

## 2024-09-10 DIAGNOSIS — G459 Transient cerebral ischemic attack, unspecified: Secondary | ICD-10-CM | POA: Diagnosis not present

## 2024-09-10 LAB — ECHOCARDIOGRAM COMPLETE
AR max vel: 2.53 cm2
AV Area VTI: 2.47 cm2
AV Area mean vel: 2.44 cm2
AV Mean grad: 3 mmHg
AV Peak grad: 6 mmHg
Ao pk vel: 1.22 m/s
Area-P 1/2: 2.63 cm2
Calc EF: 60.4 %
Height: 74 in
MV VTI: 2.23 cm2
S' Lateral: 3 cm
Single Plane A2C EF: 65.1 %
Single Plane A4C EF: 61.1 %
Weight: 3654.34 [oz_av]

## 2024-09-10 LAB — RESP PANEL BY RT-PCR (RSV, FLU A&B, COVID)  RVPGX2
Influenza A by PCR: NEGATIVE
Influenza B by PCR: NEGATIVE
Resp Syncytial Virus by PCR: NEGATIVE
SARS Coronavirus 2 by RT PCR: NEGATIVE

## 2024-09-10 LAB — LIPID PANEL
Cholesterol: 152 mg/dL (ref 0–200)
HDL: 41 mg/dL
LDL Cholesterol: 94 mg/dL (ref 0–99)
Total CHOL/HDL Ratio: 3.8 ratio
Triglycerides: 87 mg/dL
VLDL: 17 mg/dL (ref 0–40)

## 2024-09-10 LAB — GLUCOSE, CAPILLARY
Glucose-Capillary: 104 mg/dL — ABNORMAL HIGH (ref 70–99)
Glucose-Capillary: 128 mg/dL — ABNORMAL HIGH (ref 70–99)
Glucose-Capillary: 107 mg/dL — ABNORMAL HIGH (ref 70–99)
Glucose-Capillary: 121 mg/dL — ABNORMAL HIGH (ref 70–99)

## 2024-09-10 LAB — HEMOGLOBIN A1C
Hgb A1c MFr Bld: 6.6 % — ABNORMAL HIGH (ref 4.8–5.6)
Mean Plasma Glucose: 142.72 mg/dL

## 2024-09-10 MED ORDER — HYDRALAZINE HCL 20 MG/ML IJ SOLN: 10.0000 mg | Freq: Four times a day (QID) | INTRAMUSCULAR | Status: DC | PRN

## 2024-09-10 MED ORDER — HYDRALAZINE HCL 20 MG/ML IJ SOLN
10.0000 mg | Freq: Once | INTRAMUSCULAR | Status: AC
  Administered 2024-09-10: 10 mg via INTRAVENOUS
  Filled 2024-09-10: qty 1

## 2024-09-10 MED ORDER — AMLODIPINE BESYLATE 5 MG PO TABS
5.0000 mg | ORAL_TABLET | Freq: Every day | ORAL | Status: DC
  Administered 2024-09-10 – 2024-09-11 (×2): 5 mg via ORAL
  Filled 2024-09-10 (×2): qty 1

## 2024-09-10 MED ORDER — MECLIZINE HCL 25 MG PO TABS: 25.0000 mg | ORAL_TABLET | Freq: Three times a day (TID) | ORAL | Status: DC | PRN

## 2024-09-10 MED ORDER — HYDRALAZINE HCL 50 MG PO TABS
50.0000 mg | ORAL_TABLET | Freq: Four times a day (QID) | ORAL | Status: DC | PRN
  Administered 2024-09-11: 50 mg via ORAL
  Filled 2024-09-10: qty 1

## 2024-09-10 NOTE — Evaluation (Signed)
 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, right 09/09/2024   Neurogenic claudication due to lumbar spinal stenosis 11/14/2020   Leg pain 06/17/2020   PAD (peripheral artery disease) 06/17/2020   B12 deficiency 07/07/2019   Other male erectile dysfunction 07/07/2018   Encounter for general adult medical examination without abnormal findings 12/31/2017   Vaccine counseling 12/31/2017   History of normocytic normochromic anemia 08/28/2017   Acquired hypothyroidism 08/26/2017   Essential hypertension 08/26/2017   Pure hypercholesterolemia 08/26/2017   Type 2 diabetes mellitus with diabetic neuropathy, without long-term current use of insulin  (HCC) 08/26/2017   Vitamin D deficiency 08/26/2017   Spinal stenosis of lumbar region 09/08/2016   Past Medical History:  Past Medical History:  Diagnosis Date   Arthritis    Diabetes mellitus without complication (HCC)    GERD (gastroesophageal reflux disease)    Hyperlipidemia    Hypertension    Thyroid disease    Past Surgical History:  Past Surgical History:  Procedure Laterality Date   LUMBAR LAMINECTOMY/DECOMPRESSION MICRODISCECTOMY N/A 11/14/2020   Procedure: L2-5 MINIMALLY INVASIVE DECOMPRESSION;  Surgeon: Clois Fret, MD;  Location: [facility] ORS;  Service: Neurosurgery;  Laterality: N/A;   THYROIDECTOMY     HPI:  Per H&P, John Strong is a 79 y.o. male with medical history significant for DM, HTN, BPH, PAD, hypothyroidism, being admitted for TIA workup.  He presents to the ED with a 3-week history of dysarthria.  This was preceded by a several week history of dizziness and imbalance for which she saw his PCP  on 12/29 when he reported that the dizziness is mostly on looking up and down.  He does have a history of vertigo.  He was prescribed meclizine  at the time.  His dysarthria is more recent.  It is intermittent and and to him seems unrelated to the episodes of dizziness.  He again presented to PCP today and was sent to the ED for evaluation...MRI brain was normal for age with no acute findings however CTA head and neck showed a high-grade stenosis in the proximal right vertebral artery.   Assessment / Plan / Recommendation Clinical Impression  Pt seen for motor speech evaluation in the setting of TIA/stroke work up. Per chart review, pt with 3-week history of dysarthria. When prompted to identify current deficits, pt reports, tripping up on words, and endorsed sensation present for extended time, though with increased frequency in last few weeks. Presentation most characteristic of dysfluency- with pt demonstrating intermittent irregularities in flow of spontaneous speech (halting pattern). Pt reporting awareness of increased error with more complex targets. Overall, oral motor function is preserved with no overt impact on speech intelligibility. Cognitive communication status is grossly consistent with baseline. Education shared regarding impact of emotion and rate of speech on speech output and use of slow rate and rest/restart for communication repair. Pt reported understanding. Pt reported that current presentation is nearing baseline and denied overt need for SLP follow up at discharge. Given completion of education and pt report of near baseline ability, no further acute SLP services indicated at this time.     SLP Assessment  SLP Recommendation/Assessment: Patient does not need any further Speech Language Pathology Services SLP Visit Diagnosis: Other (comment) (dysfluency)  Assistance Recommended at Discharge  None  Functional Status Assessment Patient has not had a recent decline in their  functional status        SLP Evaluation Cognition  Overall Cognitive Status: Within Functional Limits for tasks assessed Arousal/Alertness: Awake/alert Orientation Level: Oriented X4 Attention: Sustained Sustained Attention: Appears intact Memory: Appears intact Awareness: Appears intact Problem Solving: Appears intact Executive Function: Self Monitoring Self Monitoring: Appears intact Safety/Judgment: Appears intact       Comprehension  Auditory Comprehension Overall Auditory Comprehension: Appears within functional limits for tasks assessed Visual Recognition/Discrimination Discrimination: Not tested Reading Comprehension Reading Status: Not tested    Expression Expression Primary Mode of Expression: Verbal Verbal Expression Overall Verbal Expression: Appears within functional limits for tasks assessed Written Expression Written Expression: Not tested   Oral / Motor  Oral Motor/Sensory Function Overall Oral Motor/Sensory Function: Within functional limits Motor Speech Overall Motor Speech: Impaired (notable for min dysfluency- question origin based on pt report of long standing history of symptom) Respiration: Within functional limits Phonation: Normal Resonance: Within functional limits Articulation: Within functional limitis Intelligibility: Intelligible Motor Planning: Not tested Effective Techniques: Slow rate           John Viviani Clapp, MS, CCC-SLP Speech Language Pathologist Rehab Services; [facility] [phone] (ascom)   John Strong 09/10/2024, 12:51 PM

## 2024-09-10 NOTE — Progress Notes (Signed)
*  PRELIMINARY RESULTS* Echocardiogram 2D Echocardiogram has been performed.  John Strong John Strong 09/10/2024, 12:27 PM

## 2024-09-10 NOTE — Plan of Care (Signed)
 Neurology plan of care  Dr. Von requested neurology consult for this patient for vertigo. He has a history of BPPV. However for the past 4 mos he has had brief episodes of vertigo triggered by looking up or down which is new. MRI brain showed no etiology to explain his sx, no stroke. CTA H&N showed high-grade stenosis of the proximal vertebral artery.   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  Recommendations: - ASA 81mg  daily + plavix  75mg  daily x21 days f/b ASA 81mg  daily monotherapy after that - Counsel patient to use caution when he needs to look up or down, do it slowly, sit down or otherwise brace himself - He should continue working with PT stability and vestibular after discharge - Meclizine  prn - I will arrange outpatient f/u with neurology  I do not see a reason for formal neurology consult at this time because I would not have any further recommendations than those above. D/w Dr. Von who is in agreement. Neurology will be available prn going forward. Don't hesitate to reach out if we can be of any further assistance.  Elida Ross, MD [facility] [phone]  If 7pm- 7am, please page neurology on call as listed in AMION.

## 2024-09-10 NOTE — Plan of Care (Signed)
" °  Problem: Fluid Volume: Goal: Ability to maintain a balanced intake and output will improve Outcome: Progressing   Problem: Nutritional: Goal: Maintenance of adequate nutrition will improve Outcome: Progressing   Problem: Skin Integrity: Goal: Risk for impaired skin integrity will decrease Outcome: Progressing   Problem: Education: Goal: Knowledge of disease or condition will improve Outcome: Not Applicable Goal: Knowledge of secondary prevention will improve (MUST DOCUMENT ALL) Outcome: Not Applicable Goal: Knowledge of patient specific risk factors will improve (DELETE if not current risk factor) Outcome: Not Applicable   Problem: Ischemic Stroke/TIA Tissue Perfusion: Goal: Complications of ischemic stroke/TIA will be minimized Outcome: Not Applicable   Problem: Coping: Goal: Will verbalize positive feelings about self Outcome: Not Applicable Goal: Will identify appropriate support needs Outcome: Not Applicable   Problem: Health Behavior/Discharge Planning: Goal: Ability to manage health-related needs will improve Outcome: Not Applicable Goal: Goals will be collaboratively established with patient/family Outcome: Not Applicable   Problem: Self-Care: Goal: Ability to participate in self-care as condition permits will improve Outcome: Not Applicable Goal: Verbalization of feelings and concerns over difficulty with self-care will improve Outcome: Not Applicable Goal: Ability to communicate needs accurately will improve Outcome: Not Applicable   Problem: Nutrition: Goal: Risk of aspiration will decrease Outcome: Not Applicable Goal: Dietary intake will improve Outcome: Not Applicable   Problem: Education: Goal: Knowledge of General Education information will improve Description: Including pain rating scale, medication(s)/side effects and non-pharmacologic comfort measures Outcome: Not Applicable   Problem: Health Behavior/Discharge Planning: Goal: Ability to  manage health-related needs will improve Outcome: Not Applicable   Problem: Clinical Measurements: Goal: Ability to maintain clinical measurements within normal limits will improve Outcome: Not Applicable Goal: Will remain free from infection Outcome: Not Applicable Goal: Diagnostic test results will improve Outcome: Not Applicable Goal: Respiratory complications will improve Outcome: Not Applicable Goal: Cardiovascular complication will be avoided Outcome: Not Applicable   Problem: Activity: Goal: Risk for activity intolerance will decrease Outcome: Not Applicable   Problem: Nutrition: Goal: Adequate nutrition will be maintained Outcome: Not Applicable   Problem: Coping: Goal: Level of anxiety will decrease Outcome: Not Applicable   Problem: Elimination: Goal: Will not experience complications related to bowel motility Outcome: Not Applicable Goal: Will not experience complications related to urinary retention Outcome: Not Applicable   Problem: Pain Managment: Goal: General experience of comfort will improve and/or be controlled Outcome: Not Applicable   Problem: Safety: Goal: Ability to remain free from injury will improve Outcome: Not Applicable   Problem: Skin Integrity: Goal: Risk for impaired skin integrity will decrease Outcome: Not Applicable   "

## 2024-09-10 NOTE — Progress Notes (Addendum)
 Pt is yellow MEWs for BP 204/95. Dr. Von at bedside at the time VS were taken and aware. Hydralazine  10mg  IV given as ordered and BP improved to 145/79.

## 2024-09-10 NOTE — Progress Notes (Signed)
 PT Cancellation Note  Patient Details Name: John Strong MRN: [id number] DOB: 23-May-1946   Cancelled Treatment:    Reason Eval/Treat Not Completed: PT screened, no needs identified, will sign off   Tyteanna Ost A Arloa 09/10/2024, 11:21 AM

## 2024-09-10 NOTE — Progress Notes (Addendum)
 [facility] Progress Note  Patient: John Strong    FMW:[id number]  DOA: 09/09/2024     Date of Service: the patient was seen and examined on 09/10/2024  Chief Complaint  Patient presents with   Aphasia   Brief hospital course: John Strong is a 79 y.o. male with medical history significant for DM, HTN, BPH, PAD, hypothyroidism, being admitted for TIA workup.  He presents to the ED with a 3-week history of dysarthria.  This was preceded by a several week history of dizziness and imbalance for which she saw his PCP on 12/29 when he reported that the dizziness is mostly on looking up and down.  He does have a history of vertigo.  He was prescribed meclizine  at the time.  His dysarthria is more recent.  It is intermittent and and to him seems unrelated to the episodes of dizziness.  He again presented to PCP today and was sent to the ED for evaluation. In the ED BP 150/87 with otherwise normal vitals.Will  Labs including CBC, CMP and troponin were all unremarkable EKG showed NSR at 78 MRI brain was normal for age with no acute findings however CTA head and neck showed a high-grade stenosis in the proximal right vertebral artery. The ED provider called and spoke with neurologist, Dr. Matthews who recommended admission for stroke workup. Hospitalist consulted for admission.        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  Prn Labetalol IV or Vasotec IV If BP greater than 220/120  LDL 94, hemoglobin A1c 6.6 Statins for LDL goal < 70, continue on Crestor  ASA 81mg  daily, Plavix  75mg  daily x 3 weeks then monotherapy thereafter Telemetry, echocardiogram Avoid dextrose  containing fluids, Maintain euglycemia, euthermia Neuro checks q4 hrs x 24 hrs and then per shift Head of bed 30 degrees Physical  therapy/Occupational therapy/Speech therapy if failed dysphagia screen F/u Neurology consult  Goal of blood pressure 120 to 160 mmHg as per neurology Started amlodipine  5 mg p.o. daily and hydralazine  as needed.   # Type 2 diabetes mellitus with diabetic neuropathy, without long-term current use of insulin  (HCC) Sliding scale insulin  coverage   # Essential hypertension Hold antihypertensives for permissive hypertension, given stuttering neurologic symptoms   # Acquired hypothyroidism Continue levothyroxine    # PAD (peripheral artery disease) Continuing rosuvastatin  and aspirin      Body mass index is 29.32 kg/m.  Interventions:  Diet: Heart healthy/carb modified DVT Prophylaxis: Subcutaneous Lovenox    Advance goals of care discussion: Full code  Family Communication: family was not present at bedside, at the time of interview.  The pt provided permission to discuss medical plan with the family. Opportunity was given to ask question and all questions were answered satisfactorily.   Disposition:  Pt is from Home, admitted with TIA, Right vertebral artery stenosis, hypertensive urgency, still has high blood pressure and stroke workup is pending, which precludes a safe discharge. Discharge to home, when stable, most likely in 1 to 2 days.  Subjective: No significant events overnight.  Patient is not feeling dizzy at this time, patient was sitting comfortably on the recliner, blood pressure remained high.  Denied any chest pain or palpitations, no shortness of breath. No focal neurological complaints  Physical Exam: General: NAD, lying comfortably Appear in no distress, affect appropriate Eyes: PERRLA ENT: Oral Mucosa Clear, moist  Neck: no  JVD,  Cardiovascular: S1 and S2 Present, no Murmur,  Respiratory: good respiratory effort, Bilateral Air entry equal and Decreased, no Crackles, no wheezes Abdomen: Bowel Sound present, Soft and no tenderness,  Skin: no  rashes Extremities: no Pedal edema, no calf tenderness Neurologic: without any new focal findings Gait not checked due to patient safety concerns  Vitals:   09/10/24 0117 09/10/24 0517 09/10/24 0800 09/10/24 1200  BP: (!) 136/94 (!) 149/78 (!) 148/90 (!) 204/95  Pulse: 63 (!) 58 63 72  Resp:      Temp: 97.7 F (36.5 C) 98 F (36.7 C) (!) 97.5 F (36.4 C) 97.7 F (36.5 C)  TempSrc: Oral Oral Oral Oral  SpO2: 97% 97% 97% 97%  Weight:      Height:        Intake/Output Summary (Last 24 hours) at 09/10/2024 1233 Last data filed at 09/10/2024 1002 Gross per 24 hour  Intake 523.15 ml  Output 850 ml  Net -326.85 ml   Filed Weights   09/09/24 1944  Weight: 103.6 kg    Data Reviewed: I have personally reviewed and interpreted daily labs, tele strips, imagings as discussed above. I reviewed all nursing notes, pharmacy notes, vitals, pertinent old records I have discussed plan of care as described above with RN and patient/family.  CBC: Recent Labs  Lab 09/09/24 1400  WBC 7.8  NEUTROABS 4.8  HGB 13.5  HCT 41.0  MCV 93.6  PLT 258   Basic Metabolic Panel: Recent Labs  Lab 09/09/24 1400  NA 136  K 3.7  CL 99  CO2 27  GLUCOSE 121*  BUN 12  CREATININE 0.88  CALCIUM  10.1    Studies: CT ANGIO HEAD NECK W WO CM Result Date: 09/09/2024 EXAM: CT HEAD WITHOUT CTA HEAD AND NECK WITH AND WITHOUT 09/09/2024 06:08:44 PM TECHNIQUE: CTA of the head and neck was performed with and without the administration of 100 mL of intravenous contrast (iohexol  (OMNIPAQUE ) 350 MG/ML injection). Noncontrast CT of the head with reconstructed 2-D images are also provided for review. Multiplanar 2D and/or 3D reformatted images are provided for review. Automated exposure control, iterative reconstruction, and/or weight based adjustment of the mA/kV was utilized to reduce the radiation dose to as low as reasonably achievable. COMPARISON: MR head without contrast 09/09/2024 and CT head without contrast  09/09/2024 CLINICAL HISTORY: Neuro deficit, acute, stroke suspected. Aphasia. FINDINGS: CT HEAD: BRAIN AND VENTRICLES: No acute intracranial hemorrhage. No mass effect. No midline shift. No extra-axial fluid collection. No evidence of acute infarct. No hydrocephalus. Mild atrophy and white matter changes are stable, likely within normal limits for age. ORBITS: No acute abnormality. SINUSES AND MASTOIDS: No acute abnormality. CTA NECK: AORTIC ARCH AND ARCH VESSELS: No dissection or arterial injury. No significant stenosis of the brachiocephalic or subclavian arteries. CERVICAL CAROTID ARTERIES: Motion somewhat obscures detail of the common carotid artery just below the bifurcation bilaterally. Mild atherosclerotic changes are present at both carotid bifurcations without significant stenoses relative to the more distal vessels. No dissection or arterial injury. CERVICAL VERTEBRAL ARTERIES: The vertebral arteries are codominant. A high grade stenosis is present in the proximal right vertebral artery. No dissection or arterial injury. LUNGS AND MEDIASTINUM: Unremarkable. SOFT TISSUES: No acute abnormality. BONES: No acute abnormality. CTA HEAD: ANTERIOR CIRCULATION: Minimal atherosclerotic changes are present at the supraclinoid ICA without significant stenosis. The right A1 segment is dominant. The anterior communicating artery is patent. No significant stenosis of the middle cerebral arteries. No aneurysm. POSTERIOR CIRCULATION: No significant  stenosis of the posterior cerebral arteries. No significant stenosis of the basilar artery. A high grade stenosis is present in the proximal right vertebral artery. No aneurysm. OTHER: No dural venous sinus thrombosis on this non-dedicated study. IMPRESSION: 1. No acute intracranial abnormality. 2. 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 3. No large vessel effusion or other abnormalities detected significant stenosis. Electronically signed by: Lonni Necessary MD 09/09/2024 07:03 PM EST RP Workstation: [id number]   MR BRAIN WO CONTRAST Result Date: 09/09/2024 EXAM: MRI BRAIN WITHOUT CONTRAST 09/09/2024 05:54:00 PM TECHNIQUE: Multiplanar multisequence MRI of the head/brain was performed without the administration of intravenous contrast. COMPARISON: CT head without contrast 09/09/2024. CLINICAL HISTORY: Headache, neuro deficit. Intermittent slurred speech and abnormal balance over the last month. FINDINGS: BRAIN AND VENTRICLES: No acute infarct. No intracranial hemorrhage. No mass. No midline shift. No hydrocephalus. The sella is unremarkable. Normal flow voids. Mild generalized atrophy and white matter changes, likely within normal limits for age. ORBITS: No significant abnormality. SINUSES AND MASTOIDS: No significant abnormality. BONES AND SOFT TISSUES: Normal marrow signal. No soft tissue abnormality. IMPRESSION: 1. No acute infarct, intracranial hemorrhage, mass, or midline shift. Normal MRI of the brain for age . Electronically signed by: lonni necessary MD 09/09/2024 06:43 PM EST RP Workstation: [id number]   CT HEAD WO CONTRAST Result Date: 09/09/2024 EXAM: CT HEAD WITHOUT CONTRAST 09/09/2024 02:49:21 PM TECHNIQUE: CT of the head was performed without the administration of intravenous contrast. Automated exposure control, iterative reconstruction, and/or weight based adjustment of the mA/kV was utilized to reduce the radiation dose to as low as reasonably achievable. COMPARISON: CT head 09/18/2023 CLINICAL HISTORY: Neuro deficit, acute, stroke suspected FINDINGS: BRAIN AND VENTRICLES: No acute hemorrhage. No evidence of acute infarct. No hydrocephalus. No extra-axial collection. No mass effect or midline shift. ORBITS: No acute abnormality. SINUSES: No acute abnormality. SOFT TISSUES AND SKULL: No acute soft tissue abnormality. No skull fracture. IMPRESSION: 1. No acute intracranial abnormality. Electronically signed by: Glendia Molt MD 09/09/2024  02:52 PM EST RP Workstation: [id number]    Scheduled Meds:   stroke: early stages of recovery book   Does not apply Once   aspirin  EC  81 mg Oral Daily   clopidogrel   75 mg Oral Daily   enoxaparin  (LOVENOX ) injection  40 mg Subcutaneous Q24H   gabapentin   300 mg Oral q morning   And   gabapentin   600 mg Oral QHS   insulin  aspart  0-15 Units Subcutaneous TID WC   insulin  aspart  0-5 Units Subcutaneous QHS   levothyroxine   112 mcg Oral Q0600   rosuvastatin   10 mg Oral QHS   Continuous Infusions:  sodium chloride  40 mL/hr at 09/10/24 0633   PRN Meds: acetaminophen  **OR** acetaminophen  (TYLENOL ) oral liquid 160 mg/5 mL **OR** acetaminophen , senna-docusate  Time spent: 35 minutes  Author: ELVAN SOR. MD [facility] 09/10/2024 12:33 PM  To reach On-call, see care teams to locate the attending and reach out to them via [email or web address]. If 7PM-7AM, please contact night-coverage If you still have difficulty reaching the attending provider, please page the [facility] (Director on Call) for [facility] on amion for assistance.

## 2024-09-10 NOTE — Plan of Care (Signed)
 " Problem: Fluid Volume: Goal: Ability to maintain a balanced intake and output will improve Outcome: Progressing   Problem: Nutritional: Goal: Maintenance of adequate nutrition will improve Outcome: Progressing   Problem: Skin Integrity: Goal: Risk for impaired skin integrity will decrease Outcome: Progressing   Problem: Ischemic Stroke/TIA Tissue Perfusion: Goal: Complications of ischemic stroke/TIA will be minimized Outcome: Not Applicable   Problem: Coping: Goal: Will verbalize positive feelings about self Outcome: Not Applicable Goal: Will identify appropriate support needs Outcome: Not Applicable   Problem: Health Behavior/Discharge Planning: Goal: Ability to manage health-related needs will improve Outcome: Not Applicable Goal: Goals will be collaboratively established with patient/family Outcome: Not Applicable   Problem: Self-Care: Goal: Ability to participate in self-care as condition permits will improve Outcome: Not Applicable Goal: Verbalization of feelings and concerns over difficulty with self-care will improve Outcome: Not Applicable Goal: Ability to communicate needs accurately will improve Outcome: Not Applicable   Problem: Nutrition: Goal: Risk of aspiration will decrease Outcome: Not Applicable Goal: Dietary intake will improve Outcome: Not Applicable   Problem: Education: Goal: Knowledge of General Education information will improve Description: Including pain rating scale, medication(s)/side effects and non-pharmacologic comfort measures Outcome: Not Applicable   Problem: Health Behavior/Discharge Planning: Goal: Ability to manage health-related needs will improve Outcome: Not Applicable   Problem: Clinical Measurements: Goal: Ability to maintain clinical measurements within normal limits will improve Outcome: Not Applicable Goal: Will remain free from infection Outcome: Not Applicable Goal: Diagnostic test results will improve Outcome:  Not Applicable Goal: Respiratory complications will improve Outcome: Not Applicable Goal: Cardiovascular complication will be avoided Outcome: Not Applicable   Problem: Activity: Goal: Risk for activity intolerance will decrease Outcome: Not Applicable   Problem: Nutrition: Goal: Adequate nutrition will be maintained Outcome: Not Applicable   Problem: Coping: Goal: Level of anxiety will decrease Outcome: Not Applicable   Problem: Elimination: Goal: Will not experience complications related to bowel motility Outcome: Not Applicable Goal: Will not experience complications related to urinary retention Outcome: Not Applicable   Problem: Pain Managment: Goal: General experience of comfort will improve and/or be controlled Outcome: Not Applicable   Problem: Safety: Goal: Ability to remain free from injury will improve Outcome: Not Applicable   Problem: Skin Integrity: Goal: Risk for impaired skin integrity will decrease Outcome: Not Applicable   Problem: Ischemic Stroke/TIA Tissue Perfusion: Goal: Complications of ischemic stroke/TIA will be minimized Outcome: Not Applicable   Problem: Coping: Goal: Will verbalize positive feelings about self Outcome: Not Applicable Goal: Will identify appropriate support needs Outcome: Not Applicable   Problem: Health Behavior/Discharge Planning: Goal: Ability to manage health-related needs will improve Outcome: Not Applicable Goal: Goals will be collaboratively established with patient/family Outcome: Not Applicable   Problem: Self-Care: Goal: Ability to participate in self-care as condition permits will improve Outcome: Not Applicable Goal: Verbalization of feelings and concerns over difficulty with self-care will improve Outcome: Not Applicable Goal: Ability to communicate needs accurately will improve Outcome: Not Applicable   Problem: Nutrition: Goal: Risk of aspiration will decrease Outcome: Not Applicable Goal: Dietary  intake will improve Outcome: Not Applicable   Problem: Education: Goal: Knowledge of General Education information will improve Description: Including pain rating scale, medication(s)/side effects and non-pharmacologic comfort measures Outcome: Not Applicable   Problem: Health Behavior/Discharge Planning: Goal: Ability to manage health-related needs will improve Outcome: Not Applicable   Problem: Clinical Measurements: Goal: Ability to maintain clinical measurements within normal limits will improve Outcome: Not Applicable Goal: Will remain free from infection Outcome: Not  Applicable Goal: Diagnostic test results will improve Outcome: Not Applicable Goal: Respiratory complications will improve Outcome: Not Applicable Goal: Cardiovascular complication will be avoided Outcome: Not Applicable   Problem: Activity: Goal: Risk for activity intolerance will decrease Outcome: Not Applicable   Problem: Nutrition: Goal: Adequate nutrition will be maintained Outcome: Not Applicable   Problem: Coping: Goal: Level of anxiety will decrease Outcome: Not Applicable   Problem: Elimination: Goal: Will not experience complications related to bowel motility Outcome: Not Applicable Goal: Will not experience complications related to urinary retention Outcome: Not Applicable   Problem: Pain Managment: Goal: General experience of comfort will improve and/or be controlled Outcome: Not Applicable   Problem: Safety: Goal: Ability to remain free from injury will improve Outcome: Not Applicable   Problem: Skin Integrity: Goal: Risk for impaired skin integrity will decrease Outcome: Not Applicable   "

## 2024-09-10 NOTE — Evaluation (Signed)
 Occupational Therapy Evaluation Patient Details Name: John Strong MRN: [id number] DOB: [birth year] Today's Date: 09/10/2024   History of Present Illness   John Strong is a 79 y.o. male with medical history significant for DM, HTN, BPH, PAD, hypothyroidism, being admitted for TIA workup.     Clinical Impressions Patient was seen for OT evaluation this date. Prior to hospital admission, patient was active and independent. Patient lives in single story home with spouse who is able to provide any/all assist/ support. Patient has been hospitalized for TIA workup. Paitent performed bed mobility, transfers and gait without AD or any assist or cues for safety. Patients UB/LB strength is 5/5 and equal, no sensation/coordination deficits noted. Patient performed LB dressing and toileting without A. Patient has no further OT needs at this time, OT to sign off. Please re-consult if there are any changes.       If plan is discharge home, recommend the following:         Functional Status Assessment   Patient has not had a recent decline in their functional status     Equipment Recommendations         Recommendations for Other Services         Precautions/Restrictions   Precautions Precautions: None Recall of Precautions/Restrictions: Intact Restrictions Weight Bearing Restrictions Per Provider Order: No     Mobility Bed Mobility Overal bed mobility: Independent                  Transfers Overall transfer level: Independent                        Balance Overall balance assessment: Independent                                         ADL either performed or assessed with clinical judgement   ADL Overall ADL's : Independent                                             Vision         Perception         Praxis         Pertinent Vitals/Pain Pain Assessment Pain Assessment: No/denies pain      Extremity/Trunk Assessment Upper Extremity Assessment Upper Extremity Assessment: Overall WFL for tasks assessed   Lower Extremity Assessment Lower Extremity Assessment: Overall WFL for tasks assessed   Cervical / Trunk Assessment Cervical / Trunk Assessment: Normal   Communication Communication Communication: Impaired Factors Affecting Communication: Other (comment) (minimal slurred speech but patient reports his speech is not at his baseline)   Cognition Arousal: Alert Behavior During Therapy: WFL for tasks assessed/performed Cognition: No apparent impairments                               Following commands: Intact       Cueing  General Comments   Cueing Techniques: Verbal cues      Exercises     Shoulder Instructions      Home Living Family/patient expects to be discharged to:: Private residence Living Arrangements: Spouse/significant other Available Help at Discharge: Family;Available 24 hours/day Type of Home: House Home Access: Stairs to  enter Entrance Stairs-Number of Steps: 4-5 Entrance Stairs-Rails: Can reach both Home Layout: One level     Bathroom Shower/Tub: Producer, Television/film/video: Standard     Home Equipment: Cane - single point;Shower seat - built in          Prior Functioning/Environment Prior Level of Function : Independent/Modified Independent;Driving             Mobility Comments: independent ADLs Comments: independent    OT Problem List:     OT Treatment/Interventions:        OT Goals(Current goals can be found in the care plan section)   Acute Rehab OT Goals Patient Stated Goal: to go home OT Goal Formulation: With patient Potential to Achieve Goals: Good   OT Frequency:       Co-evaluation              AM-PAC OT 6 Clicks Daily Activity     Outcome Measure Help from another person eating meals?: None Help from another person taking care of personal grooming?: None Help  from another person toileting, which includes using toliet, bedpan, or urinal?: None Help from another person bathing (including washing, rinsing, drying)?: None Help from another person to put on and taking off regular upper body clothing?: None Help from another person to put on and taking off regular lower body clothing?: None 6 Click Score: 24   End of Session Equipment Utilized During Treatment: Gait belt Nurse Communication: Mobility status  Activity Tolerance: Patient tolerated treatment well Patient left: in chair;with call bell/phone within reach  OT Visit Diagnosis: Dizziness and giddiness (R42)                Time: 8948-8881 OT Time Calculation (min): 27 min Charges:  OT General Charges $OT Visit: 1 Visit OT Evaluation $OT Eval Low Complexity: 1 Low  Rogers Clause, OT/L MS[facility], 09/10/2024

## 2024-09-11 DIAGNOSIS — G459 Transient cerebral ischemic attack, unspecified: Secondary | ICD-10-CM | POA: Diagnosis not present

## 2024-09-11 LAB — BASIC METABOLIC PANEL WITH GFR
Anion gap: 9 (ref 5–15)
BUN: 10 mg/dL (ref 8–23)
CO2: 29 mmol/L (ref 22–32)
Calcium: 9.7 mg/dL (ref 8.9–10.3)
Chloride: 102 mmol/L (ref 98–111)
Creatinine, Ser: 0.85 mg/dL (ref 0.61–1.24)
GFR, Estimated: >60 mL/min
Glucose, Bld: 126 mg/dL — ABNORMAL HIGH (ref 70–99)
Potassium: 4.1 mmol/L (ref 3.5–5.1)
Sodium: 140 mmol/L (ref 135–145)

## 2024-09-11 LAB — CBC
HCT: 38.7 % — ABNORMAL LOW (ref 39.0–52.0)
Hemoglobin: 12.6 g/dL — ABNORMAL LOW (ref 13.0–17.0)
MCH: 31 pg (ref 26.0–34.0)
MCHC: 32.6 g/dL (ref 30.0–36.0)
MCV: 95.1 fL (ref 80.0–100.0)
Platelets: 248 K/uL (ref 150–400)
RBC: 4.07 MIL/uL — ABNORMAL LOW (ref 4.22–5.81)
RDW: 12.2 % (ref 11.5–15.5)
WBC: 7 K/uL (ref 4.0–10.5)
nRBC: 0 % (ref 0.0–0.2)

## 2024-09-11 LAB — PHOSPHORUS: Phosphorus: 3.8 mg/dL (ref 2.5–4.6)

## 2024-09-11 LAB — MAGNESIUM: Magnesium: 2.3 mg/dL (ref 1.7–2.4)

## 2024-09-11 LAB — GLUCOSE, CAPILLARY
Glucose-Capillary: 200 mg/dL — ABNORMAL HIGH (ref 70–99)
Glucose-Capillary: 101 mg/dL — ABNORMAL HIGH (ref 70–99)

## 2024-09-11 MED ORDER — AMLODIPINE BESYLATE 5 MG PO TABS: 5.0000 mg | ORAL_TABLET | Freq: Every day | ORAL | 11 refills | Status: AC

## 2024-09-11 MED ORDER — ASPIRIN 81 MG PO TBEC: 81.0000 mg | DELAYED_RELEASE_TABLET | Freq: Every day | ORAL | 11 refills | Status: AC

## 2024-09-11 MED ORDER — ROSUVASTATIN CALCIUM 20 MG PO TABS: 20.0000 mg | ORAL_TABLET | Freq: Every day | ORAL | 11 refills | Status: AC

## 2024-09-11 MED ORDER — HYDRALAZINE HCL 50 MG PO TABS: 50.0000 mg | ORAL_TABLET | Freq: Three times a day (TID) | ORAL | Status: DC

## 2024-09-11 MED ORDER — CLOPIDOGREL BISULFATE 75 MG PO TABS: 75.0000 mg | ORAL_TABLET | Freq: Every day | ORAL | 0 refills | Status: AC

## 2024-09-11 MED ORDER — HYDRALAZINE HCL 50 MG PO TABS: 50.0000 mg | ORAL_TABLET | Freq: Three times a day (TID) | ORAL | 11 refills | Status: AC

## 2024-09-11 MED ORDER — PANTOPRAZOLE SODIUM 20 MG PO TBEC: DELAYED_RELEASE_TABLET | ORAL | 1 refills | Status: AC

## 2024-09-11 NOTE — Discharge Summary (Signed)
 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   Specialty: Family Medicine Contact information: [address] [facility] [place] [address] [phone]                Diet recommendation: {dietplan:22518}  Activity: The patient is advised to gradually reintroduce usual activities, as tolerated  Discharge Condition: stable  Code Status: {Palliative Code status:23503}   History of present illness: As per the H and P dictated on admission, ***  Hospital Course:  *** Summary of his active problems in the hospital is as following.   *** Body mass index is 29.32 kg/m.    Nutrition Interventions:        ***Pain control  - [place]  Controlled Substance Reporting System database was reviewed. - *** day supply was provided. - Patient was instructed, not to drive, operate heavy machinery, perform activities at heights, swimming or participation in water activities or provide baby sitting services while on Pain, Sleep and Anxiety Medications; until his outpatient Physician has advised to do so again.  - Also recommended to not to take more than prescribed Pain, Sleep and Anxiety Medications.  ***Patient was ambulatory without any  assistance. ***Patient was seen by physical therapy, who recommended {d/c therapy plan:22521}, ***which was arranged. On the day of the discharge the patient's vitals were stable, and no other acute medical condition were reported by patient. the patient was felt safe to be discharge at {comingfrom:22515} with {d/c therapy plan:22521}.  Consultants: *** Procedures: ***  Discharge Exam: General: Appear in no distress, no Rash; Oral Mucosa Clear, moist. Cardiovascular: S1 and S2 Present, no Murmur, Respiratory: normal respiratory effort, Bilateral Air entry present and no Crackles, no wheezes Abdomen: Bowel Sound present, Soft and no tenderness, no hernia Extremities: no Pedal edema, no calf tenderness Neurology: alert and oriented to time, place, and person affect appropriate.  Filed Weights   09/09/24 1944  Weight: 103.6 kg   Vitals:   09/11/24 1343 09/11/24 1442  BP: (!) 191/92 (!) 157/80  Pulse:  76  Resp:    Temp:    SpO2:      DISCHARGE MEDICATION: Allergies as of 09/11/2024       Reactions   Lisinopril    Other reaction(s): Angioedema        Medication List     STOP taking these medications    carvedilol 3.125 MG tablet Commonly known as: COREG   hydrochlorothiazide  25 MG tablet Commonly known as: HYDRODIURIL    meclizine  12.5 MG tablet Commonly known as: ANTIVERT    NIFEdipine  30 MG 24 hr tablet Commonly known as: ADALAT  CC   omeprazole 20 MG capsule Commonly known as: PRILOSEC       TAKE these medications    amLODipine  5 MG tablet Commonly known as: NORVASC  Take 1 tablet (5 mg  total) by mouth daily. Hold off if systolic BP less than 140 mmHg Start taking on: September 12, 2024   aspirin  EC 81 MG tablet Take 1 tablet (81 mg total) by mouth daily. Swallow whole. Start taking on: September 12, 2024   clopidogrel  75 MG tablet Commonly known as: PLAVIX  Take 1 tablet (75 mg total) by mouth daily for 18 days. Start taking on: September 12, 2024    D3 ADULT PO Take 1 tablet by mouth daily at 6 (six) AM.   docusate sodium  50 MG capsule Commonly known as: COLACE Take 50 mg by mouth 2 (two) times daily.   gabapentin  300 MG capsule Commonly known as: NEURONTIN  Take 300-600 mg by mouth See admin instructions. Tale 300 mg in the morning and 600 mg at bedtime   hydrALAZINE  50 MG tablet Commonly known as: APRESOLINE  Take 1 tablet (50 mg total) by mouth 3 (three) times daily. Hold off if systolic BP less than 150 mmHg   levothyroxine  112 MCG tablet Commonly known as: SYNTHROID  Take 112 mcg by mouth daily.   metFORMIN  500 MG tablet Commonly known as: GLUCOPHAGE  Take 500 mg by mouth 2 (two) times daily.   pantoprazole  20 MG tablet Commonly known as: Protonix  Take 2 tablets (40 mg total) by mouth daily for 30 days, THEN 1 tablet (20 mg total) daily. Start taking on: September 11, 2024   rosuvastatin  20 MG tablet Commonly known as: CRESTOR  Take 1 tablet (20 mg total) by mouth at bedtime. What changed:  medication strength how much to take   Trospium  Chloride 60 MG Cp24 Take 1 capsule (60 mg total) by mouth daily.   vitamin B-12 100 MCG tablet Commonly known as: CYANOCOBALAMIN  Take 100 mcg by mouth daily.       Allergies[1] Discharge Instructions     Ambulatory referral to Neurology   Complete by: As directed    An appointment is requested in approximately: 1 week   Call MD for:   Complete by: As directed    any weakness or numbness, any new neurological symptoms   Call MD for:  difficulty breathing, headache or visual disturbances   Complete by: As directed    Call MD for:  extreme fatigue   Complete by: As directed    Call MD for:  persistant dizziness or light-headedness   Complete by: As directed    Call MD for:  persistant nausea and vomiting   Complete by: As directed    Call MD for:  severe uncontrolled pain   Complete by: As directed    Call MD for:  temperature >100.4   Complete by: As directed     Discharge instructions   Complete by: As directed    Follow-up with PCP in 1 week, continue to monitor BP at home and follow with PCP to titrate medication accordingly.  Keep systolic BP in between 120 to 839 mmHg.  Lower blood pressure can cause decreased blood flow to the brain and cause neurological symptoms including dizziness and stroke symptoms. Follow-up with neurology in 1 to 2 weeks as an outpatient.   Increase activity slowly   Complete by: As directed        The results of significant diagnostics from this hospitalization (including imaging, microbiology, ancillary and laboratory) are listed below for reference.    Significant Diagnostic Studies: ECHOCARDIOGRAM COMPLETE Result Date: 09/10/2024    ECHOCARDIOGRAM REPORT   Patient Name:   MASSIAH MINJARES Date of Exam: 09/10/2024 Medical Rec #:  [id number]  Height:       74.0 in Accession #:    [id number]   Weight:       228.4 lb Date of Birth:  June 19, 1946    BSA:          2.300 m Patient Age:    78 years     BP:           148/90 mmHg Patient Gender: M            HR:           65 bpm. Exam Location:  [facility] Procedure: 2D Echo, Cardiac Doppler and Color Doppler (Both Spectral and Color            Flow Doppler were utilized during procedure). Indications:     Abnormal ECG R94.31  History:         Patient has no prior history of Echocardiogram examinations.                  Risk Factors:Hypertension.  Sonographer:     Bari Roar Referring Phys:  [id number] DELAYNE LULLA SOLIAN Diagnosing Phys: Redell Cave MD IMPRESSIONS  1. Left ventricular ejection fraction, by estimation, is 60 to 65%. The left ventricle has normal function. The left ventricle has no regional wall motion abnormalities. There is mild left ventricular hypertrophy. Left ventricular diastolic parameters are consistent with Grade I diastolic dysfunction (impaired relaxation).  2. Right ventricular systolic function is normal. The right ventricular size is normal.  3. The mitral valve is  normal in structure. No evidence of mitral valve regurgitation.  4. The aortic valve is tricuspid. Aortic valve regurgitation is not visualized. Aortic valve sclerosis is present, with no evidence of aortic valve stenosis.  5. Aortic dilatation noted. There is mild dilatation of the ascending aorta, measuring 39 mm.  6. The inferior vena cava is normal in size with greater than 50% respiratory variability, suggesting right atrial pressure of 3 mmHg. FINDINGS  Left Ventricle: Left ventricular ejection fraction, by estimation, is 60 to 65%. The left ventricle has normal function. The left ventricle has no regional wall motion abnormalities. The left ventricular internal cavity size was normal in size. There is  mild left ventricular hypertrophy. Left ventricular diastolic parameters are consistent with Grade I diastolic dysfunction (impaired relaxation). Right Ventricle: The right ventricular size is normal. No increase in right ventricular wall thickness. Right ventricular systolic function is normal. Left Atrium: Left atrial size was normal in size. Right Atrium: Right atrial size was normal in size. Pericardium: There is no evidence of pericardial effusion. Mitral Valve: The mitral valve is normal in structure. No evidence of mitral valve regurgitation. MV peak gradient, 5.3 mmHg. The mean mitral valve gradient is 1.0 mmHg. Tricuspid Valve: The tricuspid valve is normal in structure. Tricuspid valve regurgitation is trivial. Aortic Valve: The aortic valve is tricuspid. Aortic valve regurgitation is not visualized. Aortic valve sclerosis is present, with no evidence of aortic valve stenosis. Aortic valve mean gradient measures 3.0 mmHg. Aortic valve peak gradient measures 6.0  mmHg. Aortic valve area, by VTI measures 2.47 cm. Pulmonic Valve: The pulmonic valve was normal in structure. Pulmonic valve regurgitation is not visualized. Aorta: Aortic dilatation noted. There is mild dilatation of the ascending aorta,  measuring 39 mm. Venous: The inferior vena cava is normal in size with greater than 50% respiratory variability, suggesting right atrial pressure of 3 mmHg. IAS/Shunts: No atrial level shunt detected by color flow Doppler.  LEFT VENTRICLE PLAX 2D  LVIDd:         4.30 cm     Diastology LVIDs:         3.00 cm     LV e' medial:    5.87 cm/s LV PW:         1.30 cm     LV E/e' medial:  11.8 LV IVS:        1.40 cm     LV e' lateral:   7.29 cm/s LVOT diam:     1.90 cm     LV E/e' lateral: 9.5 LV SV:         57 LV SV Index:   25 LVOT Area:     2.84 cm  LV Volumes (MOD) LV vol d, MOD A2C: 53.6 ml LV vol d, MOD A4C: 73.1 ml LV vol s, MOD A2C: 18.7 ml LV vol s, MOD A4C: 28.4 ml LV SV MOD A2C:     34.9 ml LV SV MOD A4C:     73.1 ml LV SV MOD BP:      38.1 ml RIGHT VENTRICLE RV Basal diam:  2.70 cm RV Mid diam:    2.40 cm RV S prime:     12.30 cm/s TAPSE (M-mode): 2.3 cm LEFT ATRIUM             Index        RIGHT ATRIUM           Index LA diam:        3.70 cm 1.61 cm/m   RA Area:     13.10 cm LA Vol (A2C):   42.8 ml 18.61 ml/m  RA Volume:   26.80 ml  11.65 ml/m LA Vol (A4C):   40.7 ml 17.70 ml/m LA Biplane Vol: 42.8 ml 18.61 ml/m  AORTIC VALVE                    PULMONIC VALVE AV Area (Vmax):    2.53 cm     PV Vmax:          1.14 m/s AV Area (Vmean):   2.44 cm     PV Peak grad:     5.2 mmHg AV Area (VTI):     2.47 cm     PR End Diast Vel: 4.49 msec AV Vmax:           122.00 cm/s  RVOT Peak grad:   3 mmHg AV Vmean:          81.000 cm/s AV VTI:            0.232 m AV Peak Grad:      6.0 mmHg AV Mean Grad:      3.0 mmHg LVOT Vmax:         109.00 cm/s LVOT Vmean:        69.700 cm/s LVOT VTI:          0.202 m LVOT/AV VTI ratio: 0.87  AORTA Ao Root diam: 2.80 cm Ao Asc diam:  3.90 cm MITRAL VALVE MV Area (PHT): 2.63 cm     SHUNTS MV Area VTI:   2.23 cm     Systemic VTI:  0.20 m MV Peak grad:  5.3 mmHg     Systemic Diam: 1.90 cm MV Mean grad:  1.0 mmHg MV Vmax:       1.15 m/s MV Vmean:      56.0 cm/s MV Decel Time: 288 msec  MV E velocity: 69.20 cm/s MV A velocity:  118.00 cm/s MV E/A ratio:  0.59 MV A Prime:    11.1 cm/s Redell Cave MD Electronically signed by Redell Cave MD Signature Date/Time: 09/10/2024/3:39:47 PM    Final    CT ANGIO HEAD NECK W WO CM Result Date: 09/09/2024 EXAM: CT HEAD WITHOUT CTA HEAD AND NECK WITH AND WITHOUT 09/09/2024 06:08:44 PM TECHNIQUE: CTA of the head and neck was performed with and without the administration of 100 mL of intravenous contrast (iohexol  (OMNIPAQUE ) 350 MG/ML injection). Noncontrast CT of the head with reconstructed 2-D images are also provided for review. Multiplanar 2D and/or 3D reformatted images are provided for review. Automated exposure control, iterative reconstruction, and/or weight based adjustment of the mA/kV was utilized to reduce the radiation dose to as low as reasonably achievable. COMPARISON: MR head without contrast 09/09/2024 and CT head without contrast 09/09/2024 CLINICAL HISTORY: Neuro deficit, acute, stroke suspected. Aphasia. FINDINGS: CT HEAD: BRAIN AND VENTRICLES: No acute intracranial hemorrhage. No mass effect. No midline shift. No extra-axial fluid collection. No evidence of acute infarct. No hydrocephalus. Mild atrophy and white matter changes are stable, likely within normal limits for age. ORBITS: No acute abnormality. SINUSES AND MASTOIDS: No acute abnormality. CTA NECK: AORTIC ARCH AND ARCH VESSELS: No dissection or arterial injury. No significant stenosis of the brachiocephalic or subclavian arteries. CERVICAL CAROTID ARTERIES: Motion somewhat obscures detail of the common carotid artery just below the bifurcation bilaterally. Mild atherosclerotic changes are present at both carotid bifurcations without significant stenoses relative to the more distal vessels. No dissection or arterial injury. CERVICAL VERTEBRAL ARTERIES: The vertebral arteries are codominant. A high grade stenosis is present in the proximal right vertebral artery. No  dissection or arterial injury. LUNGS AND MEDIASTINUM: Unremarkable. SOFT TISSUES: No acute abnormality. BONES: No acute abnormality. CTA HEAD: ANTERIOR CIRCULATION: Minimal atherosclerotic changes are present at the supraclinoid ICA without significant stenosis. The right A1 segment is dominant. The anterior communicating artery is patent. No significant stenosis of the middle cerebral arteries. No aneurysm. POSTERIOR CIRCULATION: No significant stenosis of the posterior cerebral arteries. No significant stenosis of the basilar artery. A high grade stenosis is present in the proximal right vertebral artery. No aneurysm. OTHER: No dural venous sinus thrombosis on this non-dedicated study. IMPRESSION: 1. No acute intracranial abnormality. 2. 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 3. No large vessel effusion or other abnormalities detected significant stenosis. Electronically signed by: Lonni Necessary MD 09/09/2024 07:03 PM EST RP Workstation: [id number]   MR BRAIN WO CONTRAST Result Date: 09/09/2024 EXAM: MRI BRAIN WITHOUT CONTRAST 09/09/2024 05:54:00 PM TECHNIQUE: Multiplanar multisequence MRI of the head/brain was performed without the administration of intravenous contrast. COMPARISON: CT head without contrast 09/09/2024. CLINICAL HISTORY: Headache, neuro deficit. Intermittent slurred speech and abnormal balance over the last month. FINDINGS: BRAIN AND VENTRICLES: No acute infarct. No intracranial hemorrhage. No mass. No midline shift. No hydrocephalus. The sella is unremarkable. Normal flow voids. Mild generalized atrophy and white matter changes, likely within normal limits for age. ORBITS: No significant abnormality. SINUSES AND MASTOIDS: No significant abnormality. BONES AND SOFT TISSUES: Normal marrow signal. No soft tissue abnormality. IMPRESSION: 1. No acute infarct, intracranial hemorrhage, mass, or midline shift. Normal MRI of the brain for age . Electronically signed by:  lonni necessary MD 09/09/2024 06:43 PM EST RP Workstation: [id number]   CT HEAD WO CONTRAST Result Date: 09/09/2024 EXAM: CT HEAD WITHOUT CONTRAST 09/09/2024 02:49:21 PM TECHNIQUE: CT of the head was performed without the administration of intravenous contrast. Automated exposure control,  iterative reconstruction, and/or weight based adjustment of the mA/kV was utilized to reduce the radiation dose to as low as reasonably achievable. COMPARISON: CT head 09/18/2023 CLINICAL HISTORY: Neuro deficit, acute, stroke suspected FINDINGS: BRAIN AND VENTRICLES: No acute hemorrhage. No evidence of acute infarct. No hydrocephalus. No extra-axial collection. No mass effect or midline shift. ORBITS: No acute abnormality. SINUSES: No acute abnormality. SOFT TISSUES AND SKULL: No acute soft tissue abnormality. No skull fracture. IMPRESSION: 1. No acute intracranial abnormality. Electronically signed by: Glendia Molt MD 09/09/2024 02:52 PM EST RP Workstation: [id number]    Microbiology: Recent Results (from the past 240 hours)  Resp panel by RT-PCR (RSV, Flu A&B, Covid) Anterior Nasal Swab     Status: None   Collection Time: 09/09/24 11:08 PM   Specimen: Anterior Nasal Swab  Result Value Ref Range Status   SARS Coronavirus 2 by RT PCR NEGATIVE NEGATIVE Final    Comment: (NOTE) SARS-CoV-2 target nucleic acids are NOT DETECTED.  The SARS-CoV-2 RNA is generally detectable in upper respiratory specimens during the acute phase of infection. The lowest concentration of SARS-CoV-2 viral copies this assay can detect is 138 copies/mL. A negative result does not preclude SARS-Cov-2 infection and should not be used as the sole basis for treatment or other patient management decisions. A negative result may occur with  improper specimen collection/handling, submission of specimen other than nasopharyngeal swab, presence of viral mutation(s) within the areas targeted by this assay, and inadequate number of  viral copies(<138 copies/mL). A negative result must be combined with clinical observations, patient history, and epidemiological information. The expected result is Negative.  Fact Sheet for Patients:  bloggercourse.com  Fact Sheet for Healthcare Providers:  seriousbroker.it  This test is no t yet approved or cleared by the [place]  FDA and  has been authorized for detection and/or diagnosis of SARS-CoV-2 by FDA under an Emergency Use Authorization (EUA). This EUA will remain  in effect (meaning this test can be used) for the duration of the COVID-19 declaration under Section 564(b)(1) of the Act, 21 U.S.C.section 360bbb-3(b)(1), unless the authorization is terminated  or revoked sooner.       Influenza A by PCR NEGATIVE NEGATIVE Final   Influenza B by PCR NEGATIVE NEGATIVE Final    Comment: (NOTE) The Xpert Xpress SARS-CoV-2/FLU/RSV plus assay is intended as an aid in the diagnosis of influenza from Nasopharyngeal swab specimens and should not be used as a sole basis for treatment. Nasal washings and aspirates are unacceptable for Xpert Xpress SARS-CoV-2/FLU/RSV testing.  Fact Sheet for Patients: bloggercourse.com  Fact Sheet for Healthcare Providers: seriousbroker.it  This test is not yet approved or cleared by the [place]  FDA and has been authorized for detection and/or diagnosis of SARS-CoV-2 by FDA under an Emergency Use Authorization (EUA). This EUA will remain in effect (meaning this test can be used) for the duration of the COVID-19 declaration under Section 564(b)(1) of the Act, 21 U.S.C. section 360bbb-3(b)(1), unless the authorization is terminated or revoked.     Resp Syncytial Virus by PCR NEGATIVE NEGATIVE Final    Comment: (NOTE) Fact Sheet for Patients: bloggercourse.com  Fact Sheet for Healthcare  Providers: seriousbroker.it  This test is not yet approved or cleared by the [place]  FDA and has been authorized for detection and/or diagnosis of SARS-CoV-2 by FDA under an Emergency Use Authorization (EUA). This EUA will remain in effect (meaning this test can be used) for the duration of the COVID-19 declaration under Section 564(b)(1) of the Act,  21 U.S.C. section 360bbb-3(b)(1), unless the authorization is terminated or revoked.  Performed at [facility], [address] [place], [place] [address]      Labs: CBC: Recent Labs  Lab 09/09/24 1400 09/11/24 0524  WBC 7.8 7.0  NEUTROABS 4.8  --   HGB 13.5 12.6*  HCT 41.0 38.7*  MCV 93.6 95.1  PLT 258 248   Basic Metabolic Panel: Recent Labs  Lab 09/09/24 1400 09/11/24 0524  NA 136 140  K 3.7 4.1  CL 99 102  CO2 27 29  GLUCOSE 121* 126*  BUN 12 10  CREATININE 0.88 0.85  CALCIUM  10.1 9.7  MG  --  2.3  PHOS  --  3.8   Liver Function Tests: Recent Labs  Lab 09/09/24 1400  AST 22  ALT 17  ALKPHOS 80  BILITOT 0.3  PROT 8.2*  ALBUMIN 4.2   No results for input(s): LIPASE, AMYLASE in the last 168 hours. No results for input(s): AMMONIA in the last 168 hours. Cardiac Enzymes: No results for input(s): CKTOTAL, CKMB, CKMBINDEX, TROPONINI in the last 168 hours. BNP (last 3 results) No results for input(s): BNP in the last 8760 hours. CBG: Recent Labs  Lab 09/10/24 1137 09/10/24 1730 09/10/24 2130 09/11/24 0855 09/11/24 1230  GLUCAP 128* 107* 121* 200* 101*    Time spent: 35 minutes  Signed:  Elvan Sor  [facility]  ***09/11/2024 2:55 PM      [1]  Allergies Allergen Reactions   Lisinopril     Other reaction(s): Angioedema
